# Patient Record
Sex: Male | Born: 1980 | Race: White | Hispanic: No | Marital: Married | Smoking: Never smoker
Health system: Southern US, Community
[De-identification: ages and names within clinical notes are randomized; demographics above are authoritative.]

## PROBLEM LIST (undated history)

## (undated) DIAGNOSIS — M109 Gout, unspecified: Secondary | ICD-10-CM

## (undated) DIAGNOSIS — A6920 Lyme disease, unspecified: Secondary | ICD-10-CM

## (undated) HISTORY — DX: Lyme disease, unspecified: A69.20

## (undated) HISTORY — DX: Gout, unspecified: M10.9

---

## 2001-01-29 ENCOUNTER — Emergency Department (HOSPITAL_COMMUNITY): Admission: EM | Admit: 2001-01-29 | Discharge: 2001-01-30 | Payer: Self-pay | Admitting: Emergency Medicine

## 2011-12-03 DIAGNOSIS — A6920 Lyme disease, unspecified: Secondary | ICD-10-CM

## 2011-12-03 HISTORY — DX: Lyme disease, unspecified: A69.20

## 2012-04-02 ENCOUNTER — Ambulatory Visit (INDEPENDENT_AMBULATORY_CARE_PROVIDER_SITE_OTHER): Payer: Self-pay | Admitting: Family Medicine

## 2012-04-02 ENCOUNTER — Encounter: Payer: Self-pay | Admitting: Family Medicine

## 2012-04-02 VITALS — BP 129/84 | HR 68 | Ht 73.0 in | Wt 214.0 lb

## 2012-04-02 DIAGNOSIS — Z7689 Persons encountering health services in other specified circumstances: Secondary | ICD-10-CM

## 2012-04-02 DIAGNOSIS — Z23 Encounter for immunization: Secondary | ICD-10-CM

## 2012-04-02 DIAGNOSIS — Z7189 Other specified counseling: Secondary | ICD-10-CM

## 2012-04-02 NOTE — Assessment & Plan Note (Signed)
Healthy adult male, no problems, will give Tdap and flu vaccines IM today.

## 2012-04-02 NOTE — Progress Notes (Signed)
Office Note 04/02/2012  CC:  Chief Complaint  Patient presents with  . Establish Care    TDAP and Flu shot    HPI:  Nathaniel Barr is a 31 y.o. White male who is here to establish care, needs Tdap and flu. Patient's most recent primary MD: [facility]. Old records were not reviewed prior to or during today's visit.  He is the proud new father of a 75 wk old girl.  Here to establish, no acute complaints.  PMH: no signif PMH History reviewed. No pertinent past medical history.  PSH: no signif PSH History reviewed. No pertinent past surgical history.  Family History  Problem Relation Age of Onset  . Heart disease Maternal Grandfather     CABG x 2  . Cancer Maternal Grandfather     ? bone cancer?    History   Social History  . Marital Status: Married    Spouse Name: N/A    Number of Children: N/A  . Years of Education: N/A   Occupational History  . Not on file.   Social History Main Topics  . Smoking status: Never Smoker   . Smokeless tobacco: Never Used  . Alcohol Use: No  . Drug Use: No  . Sexually Active: Yes   Other Topics Concern  . Not on file   Social History Narrative   Married, has one infant daughter and an adopted son.Orig from [place].College ed: [employer], went to 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    Outpatient Encounter Prescriptions as of 04/02/2012  Medication Sig Dispense Refill  . Multiple Vitamin (MULTIVITAMIN) tablet Take 1 tablet by mouth daily.      . vitamin C (ASCORBIC ACID) 500 MG tablet Take 500 mg by mouth daily.        No Known Allergies  ROS Review of Systems: no fatigue, no fevers, no joint swelling or aches, no rash, no HAs, no vision or hearing probs, no neck pain, no abd pain, no dental problems, no tremors, no focal weakness. PE; Blood pressure 129/84, pulse 68, height 6\' 1"  (1.854 m),  weight 214 lb (97.07 kg), SpO2 99.00%. Gen: Alert, well appearing.  Patient is oriented to person, place, time, and situation. ENT:   Eyes: no injection, icteris, swelling, or exudate.  EOMI, PERRLA. Nose: no drainage or turbinate edema/swelling.  No injection or focal lesion.  Mouth: lips without lesion/swelling.  Oral mucosa pink and moist.  Dentition intact and without obvious caries or gingival swelling.  Oropharynx without erythema, exudate, or swelling.  Neck - No masses or thyromegaly or limitation in range of motion CV: RRR, no m/r/g.   LUNGS: CTA bilat, nonlabored resps, good aeration in all lung fields. ABD: soft, NT, ND, BS normal.  No hepatospenomegaly or mass.  No bruits. EXT: no clubbing, cyanosis, or edema.    Pertinent labs:  None today  ASSESSMENT AND PLAN:   New Pt: obtain old records.  Establishing care with new doctor, encounter for Healthy adult male, no problems, will give Tdap and flu vaccines IM today.    Return for return for CPE at your convenience.Marland Kitchen

## 2012-05-16 ENCOUNTER — Encounter: Payer: Self-pay | Admitting: Family Medicine

## 2013-03-13 ENCOUNTER — Ambulatory Visit (INDEPENDENT_AMBULATORY_CARE_PROVIDER_SITE_OTHER): Payer: No Typology Code available for payment source | Admitting: Family Medicine

## 2013-03-13 ENCOUNTER — Encounter: Payer: Self-pay | Admitting: Family Medicine

## 2013-03-13 VITALS — BP 126/77 | HR 74 | Temp 98.4°F | Resp 16 | Ht 71.0 in | Wt 204.0 lb

## 2013-03-13 DIAGNOSIS — Z23 Encounter for immunization: Secondary | ICD-10-CM

## 2013-03-13 DIAGNOSIS — R5381 Other malaise: Secondary | ICD-10-CM | POA: Insufficient documentation

## 2013-03-13 LAB — URINALYSIS, ROUTINE W REFLEX MICROSCOPIC
Bilirubin Urine: NEGATIVE
Hgb urine dipstick: NEGATIVE
Leukocytes, UA: NEGATIVE
Nitrite: NEGATIVE
Total Protein, Urine: NEGATIVE
WBC, UA: NONE SEEN (ref 0–?)
pH: 6.5 (ref 5.0–8.0)

## 2013-03-13 LAB — CBC WITH DIFFERENTIAL/PLATELET
Basophils Absolute: 0 10*3/uL (ref 0.0–0.1)
Eosinophils Absolute: 0 10*3/uL (ref 0.0–0.7)
HCT: 42 % (ref 39.0–52.0)
Hemoglobin: 14.7 g/dL (ref 13.0–17.0)
Lymphs Abs: 1.6 10*3/uL (ref 0.7–4.0)
MCHC: 34.9 g/dL (ref 30.0–36.0)
Monocytes Absolute: 0.6 10*3/uL (ref 0.1–1.0)
Monocytes Relative: 9.6 % (ref 3.0–12.0)
Neutro Abs: 3.9 10*3/uL (ref 1.4–7.7)
Platelets: 237 10*3/uL (ref 150.0–400.0)
RDW: 13.5 % (ref 11.5–14.6)

## 2013-03-13 LAB — COMPREHENSIVE METABOLIC PANEL
ALT: 21 U/L (ref 0–53)
AST: 22 U/L (ref 0–37)
Alkaline Phosphatase: 60 U/L (ref 39–117)
BUN: 19 mg/dL (ref 6–23)
Calcium: 9.6 mg/dL (ref 8.4–10.5)
Creatinine, Ser: 0.8 mg/dL (ref 0.4–1.5)
Total Bilirubin: 1.4 mg/dL — ABNORMAL HIGH (ref 0.3–1.2)

## 2013-03-13 LAB — TSH: TSH: 0.96 u[IU]/mL (ref 0.35–5.50)

## 2013-03-13 NOTE — Patient Instructions (Addendum)
Have your wife watch you sleep and call and report to my nurse if she says you have periods in which you stop breathing for 4-5 seconds at a time.  Try to get more sleep---go to bed earlier at night, drink max of 2 caffeinated drinks per day (no high caffeine energy drinks).  Try to get some cardiovascular exercise at least every other day--this is a healthy exercise.   Heavy physical labor at work and lots of walking on the job is good, but it is often the type of activity that brings fatigue, whereas regular cardio exercise with make you feel MORE ENERGIZED.

## 2013-03-13 NOTE — Addendum Note (Signed)
Addended by: Baldemar Lenis R on: 03/13/2013 12:02 PM   Modules accepted: Orders

## 2013-03-13 NOTE — Assessment & Plan Note (Addendum)
Most likely from sleep debt, excessive work, stress of work and home life. Exam reassuring.  History w/out red flags.  History of being treated about 1-2 yrs ago for lyme dz, pt quite concerned about possibly still having it. Will check CBC, CMET, TSH, UA w/reflex micro, and borrelia burg. Ab.   Other instructions: Have your wife watch you sleep and call and report to my nurse if she says you have periods in which you stop breathing for 4-5 seconds at a time.  Try to get more sleep---go to bed earlier at night, drink max of 2 caffeinated drinks per day (no high caffeine energy drinks).  Try to get some cardiovascular exercise at least every other day--this is a healthy exercise.   Heavy physical labor at work and lots of walking on the job is good, but it is often the type of activity that brings fatigue, whereas regular cardio exercise with make you feel MORE ENERGIZED.

## 2013-03-13 NOTE — Progress Notes (Signed)
OFFICE VISIT  03/13/2013   CC:  Chief Complaint  Patient presents with  . Fatigue    about a month, worse in the last week      HPI:    Patient is a 32 y.o. Caucasian male who presents for feeling tired. Duration--approx 1 mo, worse for the last 1 week. Up a lot in night with a fussy baby (1 yr old) since baby was born per his report.  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 He gets 4-6 hours of sleep per night.    Says he has felt drained, tired, sleepy in daytime which he says is unusual for him. Gets frequent HAs in bitemporal areas lately, normally takes 1 goodies powder and this normally gets rid of HA. He drinks 2 special water drinks with caffeine in it a day.  No coffee, no sodas, no tea.  Drinks lots of "plain" water when working and hot and this quenches his thirst.  He does urinate quite a bit--appropriate for his fluid intake.  Says he gets lots of ticks, wonders if this has anything to do with his fatigue.  Ever since he was treated for lyme dz (he can't recall what abx he was rx'd), he has had a few pills leftover and he has been in the habit of taking one pill on occasion after he notes a tick on his body.  ROS: gets ulcers in mouth approx monthly, esp summer months--this has been a long term prob--nothing new.  No swelling or redness of joints.  No unexplained fevers, no night sweats.  Denies urinary sx's.  Denies GER, GI upset, diarrhea, melena, hematochezia, or constipation.  No skin rashes. No vision or hearing changes.   Exercise: lots of activity at work but no formal exercise.  Says he eats a good diet--"lots of food", so appetite is obviously good.  He does snore but only when lying on his back but wife has not reported any apnea.    Past Medical History  Diagnosis Date  . 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 Kathi Der [facility] at [facility].   FH: dad and grandfather with DM  No past surgical history on file.  Outpatient Prescriptions Prior to Visit   Medication Sig Dispense Refill  . Multiple Vitamin (MULTIVITAMIN) tablet Take 1 tablet by mouth daily.      . vitamin C (ASCORBIC ACID) 500 MG tablet Take 500 mg by mouth daily.       No facility-administered medications prior to visit.    Allergies  Allergen Reactions  . Shrimp [Shellfish Allergy] Other (See Comments)    unspecified    ROS As per HPI  PE: Blood pressure 126/77, pulse 74, temperature 98.4 F (36.9 C), temperature source Temporal, resp. rate 16, height 5\' 11"  (1.803 m), weight 204 lb (92.534 kg), SpO2 100.00%. Gen: Alert, well appearing.  Patient is oriented to person, place, time, and situation. AFFECT: pleasant, lucid thought and speech. ENT: Ears: EACs clear, normal epithelium.  TMs with good light reflex and landmarks bilaterally.  Eyes: no injection, icteris, swelling, or exudate.  EOMI, PERRLA. Nose: no drainage or turbinate edema/swelling.  No injection or focal lesion.  Mouth: lips without lesion/swelling.  Oral mucosa pink and moist, 3-4 shallow, grey-based superficial ulcerations are noted--about 3-4 mm in size (inside of lower lip, left buccal mucosa).  Dentition intact and without obvious caries or gingival swelling.  Oropharynx without erythema, exudate, or swelling.  Neck: supple/nontender.  No LAD, mass, or TM.  CV: RRR, no m/r/g.   LUNGS: CTA bilat, nonlabored resps, good aeration in all lung fields. ABD: soft, NT, ND, BS normal.  No hepatospenomegaly or mass.  No bruits. EXT: no clubbing, cyanosis, or edema.  Musculoskeletal: no joint swelling, erythema, warmth, or tenderness.  ROM of all joints intact. Skin - no sores or suspicious lesions or rashes or color changes Neuro: CN 2-12 intact bilaterally, strength 5/5 in proximal and distal upper extremities and lower extremities bilaterally.  No tremor.  No disdiadochokinesis.  No ataxia.  Upper extremity and lower extremity DTRs symmetric.  No pronator drift.  LABS:  None today  IMPRESSION AND  PLAN:  Other malaise and fatigue Most likely from sleep debt, excessive work, stress of work and home life. Exam reassuring.  History w/out red flags.  History of being treated about 1-2 yrs ago for lyme dz, pt quite concerned about possibly still having it. Will check CBC, CMET, TSH, UA w/reflex micro, and borrelia burg. Ab.   Other instructions: Have your wife watch you sleep and call and report to my nurse if she says you have periods in which you stop breathing for 4-5 seconds at a time.  Try to get more sleep---go to bed earlier at night, drink max of 2 caffeinated drinks per day (no high caffeine energy drinks).  Try to get some cardiovascular exercise at least every other day--this is a healthy exercise.   Heavy physical labor at work and lots of walking on the job is good, but it is often the type of activity that brings fatigue, whereas regular cardio exercise with make you feel MORE ENERGIZED.   Flu vaccine IM today.  An After Visit Summary was printed and given to the patient.  FOLLOW UP: Return in about 1 month (around 04/12/2013) for f/u fatigue.

## 2014-03-09 ENCOUNTER — Emergency Department (HOSPITAL_BASED_OUTPATIENT_CLINIC_OR_DEPARTMENT_OTHER)
Admission: EM | Admit: 2014-03-09 | Discharge: 2014-03-09 | Disposition: A | Discharge status: Home / Self Care | Payer: No Typology Code available for payment source | Attending: Emergency Medicine | Admitting: Emergency Medicine

## 2014-03-09 ENCOUNTER — Encounter (HOSPITAL_BASED_OUTPATIENT_CLINIC_OR_DEPARTMENT_OTHER): Payer: Self-pay | Admitting: Emergency Medicine

## 2014-03-09 DIAGNOSIS — J029 Acute pharyngitis, unspecified: Secondary | ICD-10-CM | POA: Insufficient documentation

## 2014-03-09 DIAGNOSIS — IMO0001 Reserved for inherently not codable concepts without codable children: Secondary | ICD-10-CM | POA: Diagnosis not present

## 2014-03-09 DIAGNOSIS — R51 Headache: Secondary | ICD-10-CM | POA: Insufficient documentation

## 2014-03-09 DIAGNOSIS — R6883 Chills (without fever): Secondary | ICD-10-CM | POA: Diagnosis not present

## 2014-03-09 DIAGNOSIS — R259 Unspecified abnormal involuntary movements: Secondary | ICD-10-CM | POA: Diagnosis not present

## 2014-03-09 DIAGNOSIS — Z79899 Other long term (current) drug therapy: Secondary | ICD-10-CM | POA: Insufficient documentation

## 2014-03-09 DIAGNOSIS — R0602 Shortness of breath: Secondary | ICD-10-CM | POA: Diagnosis not present

## 2014-03-09 DIAGNOSIS — R5383 Other fatigue: Secondary | ICD-10-CM

## 2014-03-09 DIAGNOSIS — Z872 Personal history of diseases of the skin and subcutaneous tissue: Secondary | ICD-10-CM | POA: Diagnosis not present

## 2014-03-09 DIAGNOSIS — R5381 Other malaise: Secondary | ICD-10-CM | POA: Diagnosis not present

## 2014-03-09 DIAGNOSIS — R519 Headache, unspecified: Secondary | ICD-10-CM

## 2014-03-09 MED ORDER — DOXYCYCLINE HYCLATE 100 MG PO CAPS: 100.0000 mg | ORAL_CAPSULE | Freq: Two times a day (BID) | ORAL | Status: DC

## 2014-03-09 NOTE — Discharge Instructions (Signed)
General Headache Without Cause A headache is pain or discomfort felt around the head or neck area. The specific cause of a headache may not be found. There are many causes and types of headaches. A few common ones are:  Tension headaches.  Migraine headaches.  Cluster headaches.  Chronic daily headaches. HOME CARE INSTRUCTIONS   Keep all follow-up appointments with your caregiver or any specialist referral.  Only take over-the-counter or prescription medicines for pain or discomfort as directed by your caregiver.  Lie down in a dark, quiet room when you have a headache.  Keep a headache journal to find out what may trigger your migraine headaches. For example, write down:  What you eat and drink.  How much sleep you get.  Any change to your diet or medicines.  Try massage or other relaxation techniques.  Put ice packs or heat on the head and neck. Use these 3 to 4 times per day for 15 to 20 minutes each time, or as needed.  Limit stress.  Sit up straight, and do not tense your muscles.  Quit smoking if you smoke.  Limit alcohol use.  Decrease the amount of caffeine you drink, or stop drinking caffeine.  Eat and sleep on a regular schedule.  Get 7 to 9 hours of sleep, or as recommended by your caregiver.  Keep lights dim if bright lights bother you and make your headaches worse. SEEK MEDICAL CARE IF:   You have problems with the medicines you were prescribed.  Your medicines are not working.  You have a change from the usual headache.  You have nausea or vomiting. SEEK IMMEDIATE MEDICAL CARE IF:   Your headache becomes severe.  You have a fever.  You have a stiff neck.  You have loss of vision.  You have muscular weakness or loss of muscle control.  You start losing your balance or have trouble walking.  You feel faint or pass out.  You have severe symptoms that are different from your first symptoms. MAKE SURE YOU:   Understand these  instructions.  Will watch your condition.  Will get help right away if you are not doing well or get worse. Document Released: 06/20/2005 Document Revised: 09/12/2011 Document Reviewed: 07/06/2011 [facility] Patient Information 2015 [place], [place]. This information is not intended to replace advice given to you by your health care provider. Make sure you discuss any questions you have with your health care provider.    Viral Infections A viral infection can be caused by different types of viruses.Most viral infections are not serious and resolve on their own. However, some infections may cause severe symptoms and may lead to further complications. SYMPTOMS Viruses can frequently cause:  Minor sore throat.  Aches and pains.  Headaches.  Runny nose.  Different types of rashes.  Watery eyes.  Tiredness.  Cough.  Loss of appetite.  Gastrointestinal infections, resulting in nausea, vomiting, and diarrhea. These symptoms do not respond to antibiotics because the infection is not caused by bacteria. However, you might catch a bacterial infection following the viral infection. This is sometimes called a "superinfection." Symptoms of such a bacterial infection may include:  Worsening sore throat with pus and difficulty swallowing.  Swollen neck glands.  Chills and a high or persistent fever.  Severe headache.  Tenderness over the sinuses.  Persistent overall ill feeling (malaise), muscle aches, and tiredness (fatigue).  Persistent cough.  Yellow, green, or brown mucus production with coughing. HOME CARE INSTRUCTIONS   Only take  over-the-counter or prescription medicines for pain, discomfort, diarrhea, or fever as directed by your caregiver.  Drink enough water and fluids to keep your urine clear or pale yellow. Sports drinks can provide valuable electrolytes, sugars, and hydration.  Get plenty of rest and maintain proper nutrition. Soups and broths with crackers or  rice are fine. SEEK IMMEDIATE MEDICAL CARE IF:   You have severe headaches, shortness of breath, chest pain, neck pain, or an unusual rash.  You have uncontrolled vomiting, diarrhea, or you are unable to keep down fluids.  You or your child has an oral temperature above 102 F (38.9 C), not controlled by medicine.  Your baby is older than 3 months with a rectal temperature of 102 F (38.9 C) or higher.  Your baby is 36 months old or younger with a rectal temperature of 100.4 F (38 C) or higher. MAKE SURE YOU:   Understand these instructions.  Will watch your condition.  Will get help right away if you are not doing well or get worse. Document Released: 03/30/2005 Document Revised: 09/12/2011 Document Reviewed: 10/25/2010 [facility] Patient Information 2015 [place], [place]. This information is not intended to replace advice given to you by your health care provider. Make sure you discuss any questions you have with your health care provider.     Lyme Disease You may have been bitten by a tick and are to watch for the development of Lyme Disease. Lyme Disease is an infection that is caused by a bacteria The bacteria causing this disease is named Borreilia burgdorferi. If a tick is infected with this bacteria and then bites you, then Lyme Disease may occur. These ticks are carried by deer and rodents such as rabbits and mice and infest grassy as well as forested areas. Fortunately most tick bites do not cause Lyme Disease.  Lyme Disease is easier to prevent than to treat. First, covering your legs with clothing when walking in areas where ticks are possibly abundant will prevent their attachment because ticks tend to stay within inches of the ground. Second, using insecticides containing DEET can be applied on skin or clothing. Last, because it takes about 12 to 24 hours for the tick to transmit the disease after attachment to the human host, you should inspect your body for ticks twice a  day when you are in areas where Lyme Disease is common. You must look thoroughly when searching for ticks. The Ixodes tick that carries Lyme Disease is very small. It is around the size of a sesame seed (picture of tick is not actual size). Removal is best done by grasping the tick by the head and pulling it out. Do not to squeeze the body of the tick. This could inject the infecting bacteria into the bite site. Wash the area of the bite with an antiseptic solution after removal.  Lyme Disease is a disease that may affect many body systems. Because of the small size of the biting tick, most people do not notice being bitten. The first sign of an infection is usually a round red rash that extends out from the center of the tick bite. The center of the lesion may be blood colored (hemorrhagic) or have tiny blisters (vesicular). Most lesions have bright red outer borders and partial central clearing. This rash may extend out many inches in diameter, and multiple lesions may be present. Other symptoms such as fatigue, headaches, chills and fever, general achiness and swelling of lymph glands may also occur. If this first  stage of the disease is left untreated, these symptoms may gradually resolve by themselves, or progressive symptoms may occur because of spread of infection to other areas of the body.  Follow up with your caregiver to have testing and treatment if you have a tick bite and you develop any of the above complaints. Your caregiver may recommend preventative (prophylactic) medications which kill bacteria (antibiotics). Once a diagnosis of Lyme Disease is made, antibiotic treatment is highly likely to cure the disease. Effective treatment of late stage Lyme Disease may require longer courses of antibiotic therapy.  MAKE SURE YOU:   Understand these instructions.  Will watch your condition.  Will get help right away if you are not doing well or get worse. Document Released: 09/26/2000 Document  Revised: 09/12/2011 Document Reviewed: 11/28/2008 [facility] Patient Information 2015 [place], [place]. This information is not intended to replace advice given to you by your health care provider. Make sure you discuss any questions you have with your health care provider.    Doxycycline tablets or capsules What is this medicine? DOXYCYCLINE (dox i SYE kleen) is a tetracycline antibiotic. It kills certain bacteria or stops their growth. It is used to treat many kinds of infections, like dental, skin, respiratory, and urinary tract infections. It also treats acne, Lyme disease, malaria, and certain sexually transmitted infections. This medicine may be used for other purposes; ask your health care provider or pharmacist if you have questions. COMMON BRAND NAME(S): Acticlate, Adoxa, Adoxa CK, Adoxa Pak, Adoxa TT, Alodox, Avidoxy, Doxal, Monodox, Morgidox 1x, Morgidox 1x Kit, Morgidox 2x, Morgidox 2x Kit, Ocudox, Vibra-Tabs, Vibramycin What should I tell my health care provider before I take this medicine? They need to know if you have any of these conditions: -liver disease -long exposure to sunlight like working outdoors -stomach problems like colitis -an unusual or allergic reaction to doxycycline, tetracycline antibiotics, other medicines, foods, dyes, or preservatives -pregnant or trying to get pregnant -breast-feeding How should I use this medicine? Take this medicine by mouth with a full glass of water. Follow the directions on the prescription label. It is best to take this medicine without food, but if it upsets your stomach take it with food. Take your medicine at regular intervals. Do not take your medicine more often than directed. Take all of your medicine as directed even if you think you are better. Do not skip doses or stop your medicine early. Talk to your pediatrician regarding the use of this medicine in children. Special care may be needed. While this drug may be prescribed for  children as young as 62 years old for selected conditions, precautions do apply. Overdosage: If you think you have taken too much of this medicine contact a poison control center or emergency room at once. NOTE: This medicine is only for you. Do not share this medicine with others. What if I miss a dose? If you miss a dose, take it as soon as you can. If it is almost time for your next dose, take only that dose. Do not take double or extra doses. What may interact with this medicine? -antacids -barbiturates -birth control pills -bismuth subsalicylate -carbamazepine -methoxyflurane -other antibiotics -phenytoin -vitamins that contain iron -warfarin This list may not describe all possible interactions. Give your health care provider a list of all the medicines, herbs, non-prescription drugs, or dietary supplements you use. Also tell them if you smoke, drink alcohol, or use illegal drugs. Some items may interact with your medicine. What should I watch  for while using this medicine? Tell your doctor or health care professional if your symptoms do not improve. Do not treat diarrhea with over the counter products. Contact your doctor if you have diarrhea that lasts more than 2 days or if it is severe and watery. Do not take this medicine just before going to bed. It may not dissolve properly when you lay down and can cause pain in your throat. Drink plenty of fluids while taking this medicine to also help reduce irritation in your throat. This medicine can make you more sensitive to the sun. Keep out of the sun. If you cannot avoid being in the sun, wear protective clothing and use sunscreen. Do not use sun lamps or tanning beds/booths. Birth control pills may not work properly while you are taking this medicine. Talk to your doctor about using an extra method of birth control. If you are being treated for a sexually transmitted infection, avoid sexual contact until you have finished your treatment.  Your sexual partner may also need treatment. Avoid antacids, aluminum, calcium, magnesium, and iron products for 4 hours before and 2 hours after taking a dose of this medicine. If you are using this medicine to prevent malaria, you should still protect yourself from contact with mosquitos. Stay in screened-in areas, use mosquito nets, keep your body covered, and use an insect repellent. What side effects may I notice from receiving this medicine? Side effects that you should report to your doctor or health care professional as soon as possible: -allergic reactions like skin rash, itching or hives, swelling of the face, lips, or tongue -difficulty breathing -fever -itching in the rectal or genital area -pain on swallowing -redness, blistering, peeling or loosening of the skin, including inside the mouth -severe stomach pain or cramps -unusual bleeding or bruising -unusually weak or tired -yellowing of the eyes or skin Side effects that usually do not require medical attention (report to your doctor or health care professional if they continue or are bothersome): -diarrhea -loss of appetite -nausea, vomiting This list may not describe all possible side effects. Call your doctor for medical advice about side effects. You may report side effects to FDA at 1-800-FDA-1088. Where should I keep my medicine? Keep out of the reach of children. Store at room temperature, below 30 degrees C (86 degrees F). Protect from light. Keep container tightly closed. Throw away any unused medicine after the expiration date. Taking this medicine after the expiration date can make you seriously ill. NOTE: This sheet is a summary. It may not cover all possible information. If you have questions about this medicine, talk to your doctor, pharmacist, or health care provider.  2015, [employer]. (2013-04-26 13:58:06)

## 2014-03-09 NOTE — ED Notes (Signed)
Patient reports chills, weakness,  Removed tick from groin last week

## 2014-03-09 NOTE — ED Provider Notes (Signed)
CSN: [id number]     Arrival date & time 03/09/14  1834 History  This chart was scribed for Audree Camel, MD by Julian Hy, ED Scribe. The patient was seen in [facility]. The patient's care was started at 7:43 PM.     Chief Complaint  Patient presents with  . Sore Throat   The history is provided by the patient. No language interpreter was used.   HPI Comments: Treavon Castilleja is a 33 y.o. male who presents to the Emergency Department complaining at the recommendation of a "[facility] clinic" for a test for lyme disease. Pt notes associated chills, fatigue, headaches, SOB, shakiness, and body aches. Pt has had a headache today and last few days, that he attempted to relieve with Advil with minimal relief. Pt went to the minute clinic and the rapid strep screen that was negative. Pt had a deer tick bite last week on his right groin. Pt denies itching or seeing any marks. Pt denies using doxycyline for this tick bite. Pt keeps doxycycline prescription in his truck. 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 Pt notes that several years ago he was told he had the signs of lyme's disease. Pt's son has been sick and diagnosed with strep, which is why wife encouraged him to go get tested (strep neg per him). He denies sore throat. Pt denies joint pain. Pt had previous neck stiffness that has a since resolved. Pt had a negative flu test.  Past Medical History  Diagnosis Date  . 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 Kathi Der notes at [facility].   History reviewed. No pertinent past surgical history. Family History  Problem Relation Age of Onset  . Heart disease Maternal Grandfather     CABG x 2  . Cancer Maternal Grandfather     ? bone cancer?   History  Substance Use Topics  . Smoking status: Never Smoker   . Smokeless tobacco: Never Used  . Alcohol Use: No    Review of Systems  Constitutional: Positive for chills and fatigue.  HENT: Negative for sore throat.    Respiratory: Positive for shortness of breath.   Musculoskeletal: Positive for myalgias. Negative for neck stiffness.  Skin: Negative for rash.  Neurological: Positive for headaches.  All other systems reviewed and are negative.  Allergies  Shrimp  Home Medications   Prior to Admission medications   Medication Sig Start Date End Date Taking? Authorizing Provider  Multiple Vitamin (MULTIVITAMIN) tablet Take 1 tablet by mouth daily.    Historical Provider, MD  vitamin C (ASCORBIC ACID) 500 MG tablet Take 500 mg by mouth daily.    Historical Provider, MD   Triage Vitals: BP 120/71  Pulse 79  Temp(Src) 98.1 F (36.7 C) (Oral)  Resp 16  Wt 195 lb (88.451 kg)  SpO2 100% Physical Exam  Nursing note and vitals reviewed. Constitutional: He is oriented to person, place, and time. He appears well-developed and well-nourished. No distress.  HENT:  Head: Normocephalic and atraumatic.  Right Ear: External ear normal.  Left Ear: External ear normal.  Nose: Nose normal.  Mouth/Throat: Oropharynx is clear and moist. No oropharyngeal exudate.  Neck: Neck supple. No tracheal deviation present.  Cardiovascular: Normal rate, regular rhythm and normal heart sounds.   Pulmonary/Chest: Effort normal and breath sounds normal. No respiratory distress.  Abdominal: He exhibits no distension. There is no tenderness.  Musculoskeletal: Normal range of motion.  Neurological: He is alert  and oriented to person, place, and time.  Skin: Skin is warm and dry. No rash noted. No pallor.  Psychiatric: He has a normal mood and affect. His behavior is normal.    ED Course  Procedures (including critical care time) DIAGNOSTIC STUDIES: Oxygen Saturation is 100% on RA, normal by my interpretation.    COORDINATION OF CARE: 7:54 PM- Patient informed of current plan for treatment and evaluation and agrees with plan at this time.  Labs Review Labs Reviewed  ROCKY MTN SPOTTED FVR AB, IGG-BLOOD  B. BURGDORFI  ANTIBODIES    Imaging Review No results found.   EKG Interpretation None      MDM   Final diagnoses:  Nonintractable headache, unspecified chronicity pattern, unspecified headache type    Patient with generalized body aches, headache and likely viral like syndrome. Sent here for testing for lyme. He has apparently had erythema migrans rash in past, and apparently is bit by deer ticks frequently at his work. My suspicion is that his symptoms are coming from whatever illness his son and brother have had this week, but given these risk factors will test for lyme and RMSF and start treatment given he is higher risk. Recommend f/u with PCP.  This chart was scribed in my presence and reviewed by me personally.   Audree Camel, MD 03/10/14 1146

## 2014-03-09 NOTE — ED Notes (Signed)
Pt sent from [facility]. Pts son and brother have strep throat at home.  Pt has scratchy throat. Reports tick was on him last week. Sent for eval of lyme disease.

## 2014-03-12 LAB — B. BURGDORFI ANTIBODIES: B burgdorferi Ab IgG+IgM: 0.65 {ISR}

## 2014-03-12 LAB — ROCKY MTN SPOTTED FVR AB, IGG-BLOOD: RMSF IgG: 0.16 IV

## 2014-09-12 DIAGNOSIS — Z8619 Personal history of other infectious and parasitic diseases: Secondary | ICD-10-CM | POA: Insufficient documentation

## 2014-09-12 DIAGNOSIS — R000 Tachycardia, unspecified: Secondary | ICD-10-CM | POA: Diagnosis not present

## 2014-09-12 DIAGNOSIS — Y9389 Activity, other specified: Secondary | ICD-10-CM | POA: Insufficient documentation

## 2014-09-12 DIAGNOSIS — W228XXA Striking against or struck by other objects, initial encounter: Secondary | ICD-10-CM | POA: Insufficient documentation

## 2014-09-12 DIAGNOSIS — J111 Influenza due to unidentified influenza virus with other respiratory manifestations: Secondary | ICD-10-CM | POA: Diagnosis not present

## 2014-09-12 DIAGNOSIS — S0990XA Unspecified injury of head, initial encounter: Secondary | ICD-10-CM | POA: Insufficient documentation

## 2014-09-12 DIAGNOSIS — Y9289 Other specified places as the place of occurrence of the external cause: Secondary | ICD-10-CM | POA: Insufficient documentation

## 2014-09-12 DIAGNOSIS — Z79899 Other long term (current) drug therapy: Secondary | ICD-10-CM | POA: Diagnosis not present

## 2014-09-12 DIAGNOSIS — R509 Fever, unspecified: Secondary | ICD-10-CM | POA: Diagnosis present

## 2014-09-12 DIAGNOSIS — Y998 Other external cause status: Secondary | ICD-10-CM | POA: Diagnosis not present

## 2014-09-12 LAB — URINE MICROSCOPIC-ADD ON

## 2014-09-12 LAB — URINALYSIS, ROUTINE W REFLEX MICROSCOPIC
BILIRUBIN URINE: NEGATIVE
Glucose, UA: NEGATIVE mg/dL
Ketones, ur: 15 mg/dL — AB
Leukocytes, UA: NEGATIVE
NITRITE: NEGATIVE
PH: 5 (ref 5.0–8.0)
Protein, ur: NEGATIVE mg/dL
Specific Gravity, Urine: 1.025 (ref 1.005–1.030)
UROBILINOGEN UA: 0.2 mg/dL (ref 0.0–1.0)

## 2014-09-12 MED ORDER — ACETAMINOPHEN 325 MG PO TABS
650.0000 mg | ORAL_TABLET | Freq: Once | ORAL | Status: AC
  Administered 2014-09-12: 650 mg via ORAL
  Filled 2014-09-12: qty 2

## 2014-09-12 NOTE — ED Notes (Signed)
Fever, nausea, dizziness since earlier today. 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 No LOC. Does not know if the dizziness is related.

## 2014-09-13 ENCOUNTER — Emergency Department (HOSPITAL_BASED_OUTPATIENT_CLINIC_OR_DEPARTMENT_OTHER): Payer: BLUE CROSS/BLUE SHIELD

## 2014-09-13 ENCOUNTER — Emergency Department (HOSPITAL_BASED_OUTPATIENT_CLINIC_OR_DEPARTMENT_OTHER)
Admission: EM | Admit: 2014-09-13 | Discharge: 2014-09-13 | Disposition: A | Discharge status: Home / Self Care | Payer: BLUE CROSS/BLUE SHIELD | Attending: Emergency Medicine | Admitting: Emergency Medicine

## 2014-09-13 DIAGNOSIS — T1490XA Injury, unspecified, initial encounter: Secondary | ICD-10-CM

## 2014-09-13 DIAGNOSIS — R079 Chest pain, unspecified: Secondary | ICD-10-CM

## 2014-09-13 DIAGNOSIS — J111 Influenza due to unidentified influenza virus with other respiratory manifestations: Secondary | ICD-10-CM

## 2014-09-13 MED ORDER — OSELTAMIVIR PHOSPHATE 75 MG PO CAPS: 75.0000 mg | ORAL_CAPSULE | Freq: Two times a day (BID) | ORAL | Status: DC

## 2014-09-13 MED ORDER — IBUPROFEN 400 MG PO TABS
400.0000 mg | ORAL_TABLET | Freq: Once | ORAL | Status: AC
  Administered 2014-09-13: 400 mg via ORAL
  Filled 2014-09-13: qty 1

## 2014-09-13 MED ORDER — KETOROLAC TROMETHAMINE 60 MG/2ML IM SOLN: 60.0000 mg | Freq: Once | INTRAMUSCULAR | Status: DC

## 2014-09-13 MED ORDER — SODIUM CHLORIDE 0.9 % IV SOLN
Freq: Once | INTRAVENOUS | Status: AC
  Administered 2014-09-13: 02:00:00 via INTRAVENOUS

## 2014-09-13 NOTE — ED Notes (Signed)
EDP in to see pt at time of d/c 

## 2014-09-13 NOTE — Discharge Instructions (Signed)

## 2014-09-13 NOTE — ED Provider Notes (Signed)
CSN: [id number]     Arrival date & time 09/12/14  2228 History   First MD Initiated Contact with Patient 09/13/14 0053     Chief Complaint  Patient presents with  . Fever     (Consider location/radiation/quality/duration/timing/severity/associated sxs/prior Treatment) HPI Comments: Patient here complaining of fever nausea and dizziness which began today. Has had a nonproductive cough 3 days. Denies any vomiting, diarrhea. No chest or abdominal pain. Denies any urinary symptoms. Patient also complains of headache which she partially attributes to striking his head a week ago. Did have swelling initially which is since resolved. Denies any current neck pain. No photophobia at this time. Symptoms persistent and use over-the-counter medications without relief.  Patient is a 34 y.o. male presenting with fever. The history is provided by the patient.  Fever   Past Medical History  Diagnosis Date  . 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   No past surgical history on file. Family History  Problem Relation Age of Onset  . Heart disease Maternal Grandfather     CABG x 2  . Cancer Maternal Grandfather     ? bone cancer?   History  Substance Use Topics  . Smoking status: Never Smoker   . Smokeless tobacco: Never Used  . Alcohol Use: No    Review of Systems  Constitutional: Positive for fever.  All other systems reviewed and are negative.     Allergies  Shrimp  Home Medications   Prior to Admission medications   Medication Sig Start Date End Date Taking? Authorizing Provider  doxycycline (VIBRAMYCIN) 100 MG capsule Take 1 capsule (100 mg total) by mouth 2 (two) times daily. One po bid x 10 days 03/09/14   Pricilla LovelessScott Goldston, MD  Multiple Vitamin (MULTIVITAMIN) tablet Take 1 tablet by mouth daily.    Historical Provider, MD  vitamin C (ASCORBIC ACID) 500 MG tablet Take 500 mg by mouth daily.    Historical Provider, MD   BP 129/62 mmHg  Pulse  108  Temp(Src) 101.5 F (38.6 C) (Oral)  Resp 20  Ht 6' (1.829 m)  Wt 215 lb (97.523 kg)  BMI 29.15 kg/m2  SpO2 98% Physical Exam  Constitutional: He is oriented to person, place, and time. He appears well-developed and well-nourished.  Non-toxic appearance. No distress.  HENT:  Head: Normocephalic and atraumatic.  Eyes: Conjunctivae, EOM and lids are normal. Pupils are equal, round, and reactive to light.  Neck: Normal range of motion. Neck supple. No tracheal deviation present. No thyroid mass present.  Cardiovascular: Regular rhythm and normal heart sounds.  Tachycardia present.  Exam reveals no gallop.   No murmur heard. Pulmonary/Chest: Effort normal and breath sounds normal. No stridor. No respiratory distress. He has no decreased breath sounds. He has no wheezes. He has no rhonchi. He has no rales.  Abdominal: Soft. Normal appearance and bowel sounds are normal. He exhibits no distension. There is no tenderness. There is no rebound and no CVA tenderness.  Musculoskeletal: Normal range of motion. He exhibits no edema or tenderness.  Neurological: He is alert and oriented to person, place, and time. He has normal strength. No cranial nerve deficit or sensory deficit. GCS eye subscore is 4. GCS verbal subscore is 5. GCS motor subscore is 6.  Skin: Skin is warm and dry. No abrasion and no rash noted.  Psychiatric: He has a normal mood and affect. His speech is normal and behavior is normal.  Nursing  note and vitals reviewed.   ED Course  Procedures (including critical care time) Labs Review Labs Reviewed  URINALYSIS, ROUTINE W REFLEX MICROSCOPIC - Abnormal; Notable for the following:    Hgb urine dipstick MODERATE (*)    Ketones, ur 15 (*)    All other components within normal limits  URINE MICROSCOPIC-ADD ON    Imaging Review No results found.   EKG Interpretation None      MDM   Final diagnoses:  Chest pain  Trauma    Patient given Toradol and feels better.  X-rays are negative. Suspect that his influenza and will place patient on Tamiflu    Lorre Nick, MD 09/13/14 [phone]

## 2014-09-13 NOTE — ED Notes (Signed)
Back from xray

## 2014-09-18 ENCOUNTER — Ambulatory Visit (INDEPENDENT_AMBULATORY_CARE_PROVIDER_SITE_OTHER): Payer: BLUE CROSS/BLUE SHIELD | Admitting: Family Medicine

## 2014-09-18 ENCOUNTER — Encounter: Payer: Self-pay | Admitting: Family Medicine

## 2014-09-18 VITALS — BP 120/70 | HR 77 | Temp 98.4°F | Ht 71.0 in | Wt 221.0 lb

## 2014-09-18 DIAGNOSIS — J111 Influenza due to unidentified influenza virus with other respiratory manifestations: Secondary | ICD-10-CM

## 2014-09-18 DIAGNOSIS — R51 Headache: Secondary | ICD-10-CM

## 2014-09-18 DIAGNOSIS — R519 Headache, unspecified: Secondary | ICD-10-CM

## 2014-09-18 DIAGNOSIS — R69 Illness, unspecified: Principal | ICD-10-CM

## 2014-09-18 DIAGNOSIS — R5383 Other fatigue: Secondary | ICD-10-CM

## 2014-09-18 DIAGNOSIS — R110 Nausea: Secondary | ICD-10-CM

## 2014-09-18 NOTE — Progress Notes (Signed)
Pre visit review using our clinic review tool, if applicable. No additional management support is needed unless otherwise documented below in the visit note. 

## 2014-09-18 NOTE — Progress Notes (Signed)
OFFICE VISIT  09/18/2014   CC:  Chief Complaint  Patient presents with  . Headache  . Fever   HPI:    Patient is a 34 y.o. Caucasian male who presents for headache and fever.   Six days ago he got nauseous, HA, Tm 104, cough and chest congestion, no ST but achy/fatigued all over. He went to the ED (he had hit his head the week prior when working, no LOC, and he started to have some positional vertigo). When he gave this hx at hospital they got a CT scan of brain and all was normal.  Fluids were given in ED.  CXR done and was normal. He was sent home on tamiflu and has just finished this.  Last 2 days has felt really good until last night he began to feel very cold again, nauseated again, peri-orbital HA and fatigue.  Cough feels improved, worse when supine.  Not really that stuffy or congested. Took tylenol/ibup alt q3h when most ill.  Took 400 mg ibup this morning.  Past Medical History  Diagnosis Date  . 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    History reviewed. No pertinent past surgical history.  MEDS: as per HPI  Allergies  Allergen Reactions  . Shrimp [Shellfish Allergy] Other (See Comments)    unspecified    ROS As per HPI  PE: Blood pressure 120/70, pulse 77, temperature 98.4 F (36.9 C), temperature source Oral, height 5\' 11"  (1.803 m), weight 221 lb (100.245 kg), SpO2 97 %. VS: noted--normal. Gen: alert, NAD, NONTOXIC APPEARING. HEENT: eyes without injection, drainage, or swelling.  Ears: EACs clear, TMs with normal light reflex and landmarks.  Nose: Clear rhinorrhea, with some dried, crusty exudate adherent to mildly injected mucosa.  No purulent d/c.  No paranasal sinus TTP.  No facial swelling.  Throat and mouth without focal lesion.  No pharyngial swelling, erythema, or exudate.   Neck: supple, no LAD.   LUNGS: CTA bilat, nonlabored resps.   CV: RRR, no m/r/g. EXT: no c/c/e SKIN: no rash  LABS:  none  IMPRESSION  AND PLAN:  Resolving ILI, some rebound of nausea/HA/fatigue. I don't see any sign of secondary infection, and I don't see any indication at this time for blood tests or imaging to be done. Encouraged rest, hydration, and also think his nausea may be related to worsened GER since all the coughing he has done recently. Instructions: Buy generic OTC zantac 150mg , take 1 tab twice a day until cough is resolved.  Take OTC generic robitussin DM at night to help with cough.  FOLLOW UP: Return if symptoms worsen or fail to improve.

## 2014-09-18 NOTE — Patient Instructions (Signed)
Buy generic OTC zantac 150mg , take 1 tab twice a day until cough is resolved.  Take OTC generic robitussin DM at night to help with cough.

## 2014-12-04 ENCOUNTER — Ambulatory Visit: Payer: BLUE CROSS/BLUE SHIELD | Admitting: Family Medicine

## 2015-01-01 ENCOUNTER — Ambulatory Visit (INDEPENDENT_AMBULATORY_CARE_PROVIDER_SITE_OTHER): Payer: BLUE CROSS/BLUE SHIELD | Admitting: Nurse Practitioner

## 2015-01-01 ENCOUNTER — Encounter: Payer: Self-pay | Admitting: Nurse Practitioner

## 2015-01-01 VITALS — BP 116/70 | HR 82 | Temp 98.0°F | Resp 18 | Ht 71.0 in | Wt 224.0 lb

## 2015-01-01 DIAGNOSIS — B9789 Other viral agents as the cause of diseases classified elsewhere: Principal | ICD-10-CM

## 2015-01-01 DIAGNOSIS — J069 Acute upper respiratory infection, unspecified: Secondary | ICD-10-CM

## 2015-01-01 MED ORDER — PREDNISONE 10 MG PO TABS: ORAL_TABLET | ORAL | Status: DC

## 2015-01-01 MED ORDER — BENZONATATE 200 MG PO CAPS: 200.0000 mg | ORAL_CAPSULE | Freq: Three times a day (TID) | ORAL | Status: DC | PRN

## 2015-01-01 NOTE — Progress Notes (Signed)
Pre visit review using our clinic review tool, if applicable. No additional management support is needed unless otherwise documented below in the visit note. 

## 2015-01-01 NOTE — Progress Notes (Signed)
   Subjective:    Patient ID: Nathaniel Barr, male    DOB: 1981/02/03, 34 y.o.   MRN: [id number]  Cough This is a new problem. The current episode started 1 to 4 weeks ago (10d). The problem has been gradually improving. The cough is productive of sputum. Associated symptoms include chest pain (with cough), a fever (resolved), heartburn, nasal congestion (resolved) and a sore throat (resolved). Pertinent negatives include no chills, ear congestion, ear pain, headaches, shortness of breath or wheezing. Nothing aggravates the symptoms. He has tried OTC cough suppressant for the symptoms. The treatment provided mild relief.      Review of Systems  Constitutional: Positive for fever (resolved). Negative for chills.  HENT: Positive for sore throat (resolved). Negative for ear pain.   Respiratory: Positive for cough. Negative for shortness of breath and wheezing.   Cardiovascular: Positive for chest pain (with cough).  Gastrointestinal: Positive for heartburn.  Neurological: Negative for headaches.       Objective:   Physical Exam  Constitutional: He is oriented to person, place, and time. He appears well-developed and well-nourished. No distress.  HENT:  Head: Normocephalic and atraumatic.  Eyes: Conjunctivae are normal. Right eye exhibits no discharge.  Neck: Normal range of motion. No thyromegaly present.  Cardiovascular: Normal rate, regular rhythm and normal heart sounds.   No murmur heard. Pulmonary/Chest: Effort normal and breath sounds normal.  Lymphadenopathy:    He has no cervical adenopathy.  Neurological: He is alert and oriented to person, place, and time.  Skin: Skin is warm and dry.  Psychiatric: He has a normal mood and affect. His behavior is normal. Thought content normal.  Vitals reviewed.         Assessment & Plan:  1. Viral URI with cough Symptom support - benzonatate (TESSALON) 200 MG capsule; Take 1 capsule (200 mg total) by mouth 3 (three) times daily  as needed for cough.  Dispense: 60 capsule; Refill: 0 - predniSONE (DELTASONE) 10 MG tablet; Take 4Tpo qam X 2d, then 3T po qam X 2d, then 2T po qd X 2d, then 1T po qam X 2d.  Dispense: 20 tablet; Refill: 0  F/u PRN

## 2015-01-01 NOTE — Patient Instructions (Signed)
This is a viral illness. Cough may last 3-4 weeks. Start prednisone. Take in mornings as it can keep you awake. Take benzonatate capsules to decrease dry cough. Rest. Let us know if you develop fever or chest pain with inspiration.  Bronchitis Bronchitis is inflammation of the airways that extend from the windpipe into the lungs (bronchi). The inflammation often causes mucus to develop, which leads to a cough. If the inflammation becomes severe, it may cause shortness of breath. CAUSES  Bronchitis may be caused by:   Viral infections.   Bacteria.   Cigarette smoke.   Allergens, pollutants, and other irritants.  SIGNS AND SYMPTOMS  The most common symptom of bronchitis is a frequent cough that produces mucus. Other symptoms include:  Fever.   Body aches.   Chest congestion.   Chills.   Shortness of breath.   Sore throat.  DIAGNOSIS  Bronchitis is usually diagnosed through a medical history and physical exam. Tests, such as chest X-rays, are sometimes done to rule out other conditions.  TREATMENT  You may need to avoid contact with whatever caused the problem (smoking, for example). Medicines are sometimes needed. These may include:  Antibiotics. These may be prescribed if the condition is caused by bacteria.  Cough suppressants. These may be prescribed for relief of cough symptoms.   Inhaled medicines. These may be prescribed to help open your airways and make it easier for you to breathe.   Steroid medicines. These may be prescribed for those with recurrent (chronic) bronchitis. HOME CARE INSTRUCTIONS  Get plenty of rest.   Drink enough fluids to keep your urine clear or pale yellow (unless you have a medical condition that requires fluid restriction). Increasing fluids may help thin your secretions and will prevent dehydration.   Only take over-the-counter or prescription medicines as directed by your health care provider.  Only take antibiotics as  directed. Make sure you finish them even if you start to feel better.  Avoid secondhand smoke, irritating chemicals, and strong fumes. These will make bronchitis worse. If you are a smoker, quit smoking. Consider using nicotine gum or skin patches to help control withdrawal symptoms. Quitting smoking will help your lungs heal faster.   Put a cool-mist humidifier in your bedroom at night to moisten the air. This may help loosen mucus. Change the water in the humidifier daily. You can also run the hot water in your shower and sit in the bathroom with the door closed for 5 10 minutes.   Follow up with your health care provider as directed.   Wash your hands frequently to avoid catching bronchitis again or spreading an infection to others.  SEEK MEDICAL CARE IF: Your symptoms do not improve after 1 week of treatment.  SEEK IMMEDIATE MEDICAL CARE IF:  Your fever increases.  You have chills.   You have chest pain.   You have worsening shortness of breath.   You have bloody sputum.  You faint.  You have lightheadedness.  You have a severe headache.   You vomit repeatedly. MAKE SURE YOU:   Understand these instructions.  Will watch your condition.  Will get help right away if you are not doing well or get worse. Document Released: 06/20/2005 Document Revised: 04/10/2013 Document Reviewed: 02/12/2013 [facility] Patient Information 2014 [employer], [place].

## 2015-07-13 ENCOUNTER — Encounter: Payer: Self-pay | Admitting: Nurse Practitioner

## 2015-07-13 ENCOUNTER — Ambulatory Visit (INDEPENDENT_AMBULATORY_CARE_PROVIDER_SITE_OTHER): Payer: BLUE CROSS/BLUE SHIELD | Admitting: Nurse Practitioner

## 2015-07-13 VITALS — BP 129/74 | HR 84 | Temp 97.8°F | Ht 71.0 in | Wt 231.0 lb

## 2015-07-13 DIAGNOSIS — J069 Acute upper respiratory infection, unspecified: Secondary | ICD-10-CM

## 2015-07-13 DIAGNOSIS — J209 Acute bronchitis, unspecified: Secondary | ICD-10-CM

## 2015-07-13 MED ORDER — AZITHROMYCIN 250 MG PO TABS: ORAL_TABLET | ORAL | Status: DC

## 2015-07-13 MED ORDER — METHYLPREDNISOLONE ACETATE 80 MG/ML IJ SUSP
80.0000 mg | Freq: Once | INTRAMUSCULAR | Status: AC
  Administered 2015-07-13: 80 mg via INTRAMUSCULAR

## 2015-07-13 MED ORDER — BENZONATATE 100 MG PO CAPS: 100.0000 mg | ORAL_CAPSULE | Freq: Three times a day (TID) | ORAL | Status: DC | PRN

## 2015-07-13 NOTE — Progress Notes (Signed)
  Subjective:     Nathaniel Barr is a 35 y.o. male who presents for evaluation of sinus pain. Symptoms include: congestion, cough, facial pain, headaches, nasal congestion and sinus pressure. Onset of symptoms was 2 weeks ago. Symptoms have been unchanged since that time. Past history is significant for no history of pneumonia or bronchitis. Patient is a non-smoker.  The following portions of the patient's history were reviewed and updated as appropriate: allergies, current medications, past family history, past medical history, past social history, past surgical history and problem list.  Review of Systems Pertinent items are noted in HPI.   Objective:    BP 129/74 mmHg  Pulse 84  Temp(Src) 97.8 F (36.6 C) (Oral)  Ht 5\' 11"  (1.803 m)  Wt 231 lb (104.781 kg)  BMI 32.23 kg/m2 General appearance: alert and cooperative Eyes: conjunctivae/corneas clear. PERRL, EOM's intact. Fundi benign. Ears: normal TM's and external ear canals both ears Nose: clear and copious discharge, moderate congestion, turbinates red, no sinus tenderness Throat: lips, mucosa, and tongue normal; teeth and gums normal Neck: no adenopathy, no carotid bruit, no JVD, supple, symmetrical, trachea midline and thyroid not enlarged, symmetric, no tenderness/mass/nodules Lungs: clear to auscultation bilaterally and dry toght cough Heart: regular rate and rhythm, S1, S2 normal, no murmur, click, rub or gallop    Assessment:    Acute bacterial sinusitis.    Plan:  1. Take meds as prescribed 2. Use a cool mist humidifier especially during the winter months and when heat has been humid. 3. Use saline nose sprays frequently 4. Saline irrigations of the nose can be very helpful if done frequently.  * 4X daily for 1 week*  * Use of a nettie pot can be helpful with this. Follow directions with this* 5. Drink plenty of fluids 6. Keep thermostat turn down low 7.For any cough or congestion  Use plain Mucinex- regular  strength or max strength is fine   * Children- consult with Pharmacist for dosing 8. For fever or aces or pains- take tylenol or ibuprofen appropriate for age and weight.  * for fevers greater than 101 orally you may alternate ibuprofen and tylenol every  3 hours.    Meds ordered this encounter  Medications  . azithromycin (ZITHROMAX) 250 MG tablet    Sig: Two tablets day one, then one tablet daily next 4 days.    Dispense:  6 tablet    Refill:  0    Order Specific Question:  Supervising Provider    Answer:  Ernestina PennaMOORE, DONALD W [[id number]]  . benzonatate (TESSALON PERLES) 100 MG capsule    Sig: Take 1 capsule (100 mg total) by mouth 3 (three) times daily as needed for cough.    Dispense:  20 capsule    Refill:  0    Order Specific Question:  Supervising Provider    Answer:  Ernestina PennaMOORE, DONALD W [[id number]]  . methylPREDNISolone acetate (DEPO-MEDROL) injection 80 mg    Sig:    Mary-Margaret Daphine DeutscherMartin, FNP

## 2015-07-13 NOTE — Patient Instructions (Signed)

## 2016-01-21 ENCOUNTER — Ambulatory Visit (INDEPENDENT_AMBULATORY_CARE_PROVIDER_SITE_OTHER): Payer: BLUE CROSS/BLUE SHIELD

## 2016-01-21 ENCOUNTER — Encounter: Payer: Self-pay | Admitting: Pediatrics

## 2016-01-21 ENCOUNTER — Ambulatory Visit (INDEPENDENT_AMBULATORY_CARE_PROVIDER_SITE_OTHER): Payer: BLUE CROSS/BLUE SHIELD | Admitting: Pediatrics

## 2016-01-21 VITALS — BP 124/82 | HR 76 | Temp 98.3°F | Ht 71.0 in | Wt 229.8 lb

## 2016-01-21 DIAGNOSIS — M533 Sacrococcygeal disorders, not elsewhere classified: Secondary | ICD-10-CM | POA: Diagnosis not present

## 2016-01-21 NOTE — Patient Instructions (Signed)
Tailbone Injury °The tailbone (coccyx) is the small bone at the lower end of the spine. A tailbone injury may involve stretched ligaments, bruising, or a broken bone (fracture). Tailbone injuries can be painful, and some may take a long time to heal. °CAUSES °This condition is often caused by falling and landing on the tailbone. Other causes include: °· Repeated strain or friction from actions such as rowing and bicycling. °· Childbirth. °In some cases, the cause may not be known. °RISK FACTORS °This condition is more common in women than in men. °SYMPTOMS °Symptoms of this condition include: °· Pain in the lower back, especially when sitting. °· Pain or difficulty when standing up from a sitting position. °· Bruising in the tailbone area. °· Painful bowel movements. °· In women, pain during intercourse. °DIAGNOSIS °This condition may be diagnosed based on your symptoms and a physical exam. X-rays may be taken if a fracture is suspected. You may also have other tests, such as a CT scan or MRI. °TREATMENT °This condition may be treated with medicines to help relieve your pain. Most tailbone injuries heal on their own in 4-6 weeks. However, recovery time may be longer if the injury involves a fracture. °HOME CARE INSTRUCTIONS °· Take medicines only as directed by your health care provider. °· If directed, apply ice to the injured area: °¨ Put ice in a plastic bag. °¨ Place a towel between your skin and the bag. °¨ Leave the ice on for 20 minutes, 2-3 times per day for the first 1-2 days. °· Sit on a large, rubber or inflated ring or cushion to ease your pain. Lean forward when you are sitting to help decrease discomfort. °· Avoid sitting for long periods of time. °· Increase your activity as the pain allows. Perform any exercises that are recommended by your health care provider or physical therapist. °· If you have pain during bowel movements, use stool softeners as directed by your health care provider. °· Eat a  diet that includes plenty of fiber to help prevent constipation. °· Keep all follow-up visits as directed by your health care provider. This is important. °PREVENTION °Wear appropriate padding and sports gear when bicycling and rowing. This can help to prevent developing an injury that is caused by repeated strain or friction. °SEEK MEDICAL CARE IF: °· Your pain becomes worse. °· Your bowel movements cause a great deal of discomfort. °· You are unable to have a bowel movement. °· You have uncontrolled urine loss (urinary incontinence). °· You have a fever. °  °This information is not intended to replace advice given to you by your health care provider. Make sure you discuss any questions you have with your health care provider. °  °Document Released: 06/17/2000 Document Revised: 11/04/2014 Document Reviewed: 06/16/2014 °[employer] Interactive Patient Education ©2016 [employer] ° °

## 2016-01-21 NOTE — Progress Notes (Signed)
    Subjective:    Patient ID: Nathaniel Barr, male    DOB: 10/16/1980, 35 y.o.   MRN: [id number]  CC: tail bone pain  HPI: Nathaniel Nathaniel Barr is a 35 y.o. male presenting for tail bone pain  Started last Septemner, 10 mo ago Depends on what he does if it hurts more or not Sitting on the floor hurts, sitting  Started after horse trip, fell off horse several times, hit hard on his bottom at least once, hitting the back of the saddle hard with some jumps Had never been on a horse before this Larey SeatFell on back in highschool with pole vaulting, bed ridden for about a month then Pain in base of tail bone No weakness No incontinence Appetite good Taking ibuprofen daily hree at night, two in th emorning for pulled muscle in chest at work about a week a ago    [facility] 01/21/2016  Decreased Interest 0  Down, Depressed, Hopeless 0  PHQ - 2 Score 0     Relevant past medical, surgical, family and social history reviewed and updated as indicated.  Interim medical history since our last visit reviewed. Allergies and medications reviewed and updated.  ROS: Per HPI unless specifically indicated above  History  Smoking status  . Never Smoker   Smokeless tobacco  . Never Used       Objective:    BP 124/82 mmHg  Pulse 76  Temp(Src) 98.3 F (36.8 C) (Oral)  Ht 5\' 11"  (1.803 m)  Wt 229 lb 12.8 oz (104.237 kg)  BMI 32.06 kg/m2  Wt Readings from Last 3 Encounters:  01/21/16 229 lb 12.8 oz (104.237 kg)  07/13/15 231 lb (104.781 kg)  01/01/15 224 lb (101.606 kg)     Gen: NAD, alert, cooperative with exam, NCAT EYES: EOMI, no scleral injection or icterus Ext: No edema, warm Neuro: Alert and oriented, strength equal b/l UE, no pain with flex/ext knee, hip flexion MSK: tender with palpation over sacrum     Assessment & Plan:    Nathaniel Barr was seen today for tail bone pain, ongoing for 10 months since injury. Has been using NSAIDs, with minimal relief. Discussed  conservative management, soft seats, avoiding positions that cause pain. Xray with no fracture. Pt interested in other options for treatment due to ongoing pain.   Diagnoses and all orders for this visit:  Coccydynia -     DG Sacrum/Coccyx; Future -     Ambulatory referral to Orthopedic Surgery  Follow up plan: Return in about 4 weeks (around 02/18/2016).  Rex Krasarol Carrissa Taitano, MD [facility] 01/21/2016, 12:01 PM

## 2016-02-25 DIAGNOSIS — Z3009 Encounter for other general counseling and advice on contraception: Secondary | ICD-10-CM | POA: Diagnosis not present

## 2016-03-01 ENCOUNTER — Ambulatory Visit: Payer: BLUE CROSS/BLUE SHIELD | Admitting: Pediatrics

## 2016-03-02 ENCOUNTER — Encounter: Payer: Self-pay | Admitting: Nurse Practitioner

## 2016-05-12 DIAGNOSIS — R05 Cough: Secondary | ICD-10-CM | POA: Diagnosis not present

## 2016-05-12 DIAGNOSIS — J00 Acute nasopharyngitis [common cold]: Secondary | ICD-10-CM | POA: Diagnosis not present

## 2016-05-12 DIAGNOSIS — R110 Nausea: Secondary | ICD-10-CM | POA: Diagnosis not present

## 2016-05-12 DIAGNOSIS — J069 Acute upper respiratory infection, unspecified: Secondary | ICD-10-CM | POA: Diagnosis not present

## 2016-05-12 DIAGNOSIS — Z23 Encounter for immunization: Secondary | ICD-10-CM | POA: Diagnosis not present

## 2016-06-29 ENCOUNTER — Other Ambulatory Visit: Payer: Self-pay | Admitting: *Deleted

## 2016-06-29 MED ORDER — OSELTAMIVIR PHOSPHATE 75 MG PO CAPS: 75.0000 mg | ORAL_CAPSULE | Freq: Two times a day (BID) | ORAL | 0 refills | Status: DC

## 2016-07-15 DIAGNOSIS — Z302 Encounter for sterilization: Secondary | ICD-10-CM | POA: Diagnosis not present

## 2016-08-29 ENCOUNTER — Ambulatory Visit (HOSPITAL_COMMUNITY)
Admission: EM | Admit: 2016-08-29 | Discharge: 2016-08-29 | Discharge status: Absent without leave | Payer: BLUE CROSS/BLUE SHIELD

## 2016-09-13 ENCOUNTER — Other Ambulatory Visit: Payer: Self-pay | Admitting: Family

## 2017-01-10 ENCOUNTER — Ambulatory Visit (INDEPENDENT_AMBULATORY_CARE_PROVIDER_SITE_OTHER): Payer: BLUE CROSS/BLUE SHIELD | Admitting: Family Medicine

## 2017-01-10 ENCOUNTER — Encounter: Payer: Self-pay | Admitting: Family Medicine

## 2017-01-10 ENCOUNTER — Telehealth: Payer: Self-pay | Admitting: Family Medicine

## 2017-01-10 ENCOUNTER — Ambulatory Visit (HOSPITAL_BASED_OUTPATIENT_CLINIC_OR_DEPARTMENT_OTHER)
Admission: RE | Admit: 2017-01-10 | Discharge: 2017-01-10 | Disposition: A | Discharge status: Home / Self Care | Payer: BLUE CROSS/BLUE SHIELD | Source: Ambulatory Visit | Attending: Family Medicine | Admitting: Family Medicine

## 2017-01-10 VITALS — BP 122/75 | HR 65 | Temp 98.6°F | Resp 20 | Wt 226.5 lb

## 2017-01-10 DIAGNOSIS — M79672 Pain in left foot: Secondary | ICD-10-CM | POA: Insufficient documentation

## 2017-01-10 DIAGNOSIS — Z7689 Persons encountering health services in other specified circumstances: Secondary | ICD-10-CM

## 2017-01-10 DIAGNOSIS — M7989 Other specified soft tissue disorders: Secondary | ICD-10-CM | POA: Diagnosis not present

## 2017-01-10 LAB — URIC ACID: URIC ACID, SERUM: 7.8 mg/dL (ref 4.0–7.8)

## 2017-01-10 LAB — SEDIMENTATION RATE: Sed Rate: 6 mm/hr (ref 0–15)

## 2017-01-10 NOTE — Patient Instructions (Signed)
Low-Purine Diet Purines are compounds that affect the level of uric acid in your body. A low-purine diet is a diet that is low in purines. Eating a low-purine diet can prevent the level of uric acid in your body from getting too high and causing gout or kidney stones or both. What do I need to know about this diet?  Choose low-purine foods. Examples of low-purine foods are listed in the next section.  Drink plenty of fluids, especially water. Fluids can help remove uric acid from your body. Try to drink 8-16 cups (1.9-3.8 L) a day.  Limit foods high in fat, especially saturated fat, as fat makes it harder for the body to get rid of uric acid. Foods high in saturated fat include pizza, cheese, ice cream, whole milk, fried foods, and gravies. Choose foods that are lower in fat and lean sources of protein. Use olive oil when cooking as it contains healthy fats that are not high in saturated fat.  Limit alcohol. Alcohol interferes with the elimination of uric acid from your body. If you are having a gout attack, avoid all alcohol.  Keep in mind that different people's bodies react differently to different foods. You will probably learn over time which foods do or do not affect you. If you discover that a food tends to cause your gout to flare up, avoid eating that food. You can more freely enjoy foods that do not cause problems. If you have any questions about a food item, talk to your dietitian or health care provider. Which foods are low, moderate, and high in purines? The following is a list of foods that are low, moderate, and high in purines. You can eat any amount of the foods that are low in purines. You may be able to have small amounts of foods that are moderate in purines. Ask your health care provider how much of a food moderate in purines you can have. Avoid foods high in purines. Grains  Foods low in purines: Enriched white bread, pasta, rice, cake, cornbread, popcorn.  Foods moderate in  purines: Whole-grain breads and cereals, wheat germ, bran, oatmeal. Uncooked oatmeal. Dry wheat bran or wheat germ.  Foods high in purines: Pancakes, French toast, biscuits, muffins. Vegetables  Foods low in purines: All vegetables, except those that are moderate in purines.  Foods moderate in purines: Asparagus, cauliflower, spinach, mushrooms, green peas. Fruits  All fruits are low in purines. Meats and other Protein Foods  Foods low in purines: Eggs, nuts, peanut butter.  Foods moderate in purines: 80-90% lean beef, lamb, veal, pork, poultry, fish, eggs, peanut butter, nuts. Crab, lobster, oysters, and shrimp. Cooked dried beans, peas, and lentils.  Foods high in purines: Anchovies, sardines, herring, mussels, tuna, codfish, scallops, trout, and haddock. Bacon. Organ meats (such as liver or kidney). Tripe. Game meat. Goose. Sweetbreads. Dairy  All dairy foods are low in purines. Low-fat and fat-free dairy products are best because they are low in saturated fat. Beverages  Drinks low in purines: Water, carbonated beverages, tea, coffee, cocoa.  Drinks moderate in purines: Soft drinks and other drinks sweetened with high-fructose corn syrup. Juices. To find whether a food or drink is sweetened with high-fructose corn syrup, look at the ingredients list.  Drinks high in purines: Alcoholic beverages (such as beer). Condiments  Foods low in purines: Salt, herbs, olives, pickles, relishes, vinegar.  Foods moderate in purines: Butter, margarine, oils, mayonnaise. Fats and Oils  Foods low in purines: All types, except gravies   and sauces made with meat.  Foods high in purines: Gravies and sauces made with meat. Other Foods  Foods low in purines: Sugars, sweets, gelatin. Cake. Soups made without meat.  Foods moderate in purines: Meat-based or fish-based soups, broths, or bouillons. Foods and drinks sweetened with high-fructose corn syrup.  Foods high in purines: High-fat desserts  (such as ice cream, cookies, cakes, pies, doughnuts, and chocolate). Contact your dietitian for more information on foods that are not listed here. This information is not intended to replace advice given to you by your health care provider. Make sure you discuss any questions you have with your health care provider. Document Released: 10/15/2010 Document Revised: 11/26/2015 Document Reviewed: 05/27/2013 Elsevier Interactive Patient Education  2017 [employer]    Try to look for triggers in diet that could be cause if gout.  Get xray today at [facility] if able.  This could be either gout flares or you may arthritis in the joint that is flaring with overuse.  Advil is ok for pain/swelling.  Wear more supportive boots while at work. Make sure proper fitting boots, wide enough for foot as well.

## 2017-01-10 NOTE — Telephone Encounter (Signed)
Please call pt: - his xray is normal. No signs of arthritis or injury to  the joint (toe) by xray. We will let him know about his labs once resulted.

## 2017-01-10 NOTE — Telephone Encounter (Signed)
Spoke with patient reviewed xray results. 

## 2017-01-10 NOTE — Progress Notes (Signed)
Nathaniel Barr , Oct 12, 1980, 36 y.o., male MRN: [id number] Patient Care Team    Relationship Specialty Notifications Start End  Chevis Pretty, [place] PCP - General Family Medicine  07/13/15     Chief Complaint  Patient presents with  . Foot Pain    x 5 days left   Pt has an establishment appt with Dr. Anitra Lauth next week.    Subjective: Pt presents for an OV with complaints of left foot/toe pain  of 5 days  duration.  Associated symptoms include redness, swelling and pain. Pt denied fever, chills, injury or h/o arthritis or gout. He denies alcohol use or high purine diet, with the exception of some seafood. He reports the same area (left large toe) had a similar presentation about 1 month ago and both times resolved with NSAIDS and Ice. Pt has tried advil  to ease their symptoms. He reports today the redness/swelling is resolved and the tenderness is almost completely gone. 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   No fhx of inflammatory arthritis    Depression screen [facility] 2/9 01/21/2016  Decreased Interest 0  Down, Depressed, Hopeless 0  PHQ - 2 Score 0    Allergies  Allergen Reactions  . Shrimp [Shellfish Allergy] Other (See Comments)    unspecified   Social History  Substance Use Topics  . Smoking status: Never Smoker  . Smokeless tobacco: Never Used  . Alcohol use No   Past Medical History:  Diagnosis Date  . 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 Tawanna Sat notes at [facility]   History reviewed. No pertinent surgical history. Family History  Problem Relation Age of Onset  . Heart disease Maternal Grandfather        CABG x 2  . Cancer Maternal Grandfather        ? bone cancer?   Allergies as of 01/10/2017      Reactions   Shrimp [shellfish Allergy] Other (See Comments)   unspecified      Medication List       Accurate as of 01/10/17  1:28 PM. Always use your most  recent med list.          multivitamin tablet Take 1 tablet by mouth daily.       All past medical history, surgical history, allergies, family history, immunizations andmedications were updated in the EMR today and reviewed under the history and medication portions of their EMR.     ROS: Negative, with the exception of above mentioned in HPI   Objective:  BP 122/75 (BP Location: Left Arm, Patient Position: Sitting, Cuff Size: Large)   Pulse 65   Temp 98.6 F (37 C)   Resp 20   Wt 226 lb 8 oz (102.7 kg)   SpO2 99%   BMI 31.59 kg/m  Body mass index is 31.59 kg/m. Gen: Afebrile. No acute distress. Nontoxic in appearance, well developed, well nourished.  HENT: AT. [place].MMM, no oral lesions. Eyes:Pupils Equal Round Reactive to light, Extraocular movements intact,  Conjunctiva without redness, discharge or icterus. MSK: no erythema, no soft tissue swelling, mild TTP medial aspect 1 st metatarsal joint. Full ROM, good cap refill. Sensation intact Skin: no rashes, purpura or petechiae.  Neuro:  Normal gait. PERLA. EOMi. Alert. Oriented x3   No exam data present No results found. No results found for this or any previous visit (from the past 24  hour(s)).  Assessment/Plan: Lucia Mccreadie is a 36 y.o. male present for OV for  Left foot pain - improved today, only mild TTP medial large toe joint line. Discussed different possible etiologies of pain, including gout flares vs arthritic joint with flares. Seems to respond with ice and NSAIDS.  - low purine diet education provided and pt was encouraged to review to see if he consumes a high purine diet around the time of flares.  - he was encourage to wear wide enough boots, with good support when working (not slip on cowboy boots) - labs and xray ordered, may not lead to diagnosis given it is resolving, but may show elevated uric acid level suggesting gout over arthritis.  - Sed Rate (ESR) - Uric acid - DG Toe Great Left; Future -  F/U dependent on image/lab results.    Reviewed expectations re: course of current medical issues.  Discussed self-management of symptoms.  Outlined signs and symptoms indicating need for more acute intervention.  Patient verbalized understanding and all questions were answered.  Patient received an After-Visit Summary.    Orders Placed This Encounter  Procedures  . DG Toe Great Left  . Sed Rate (ESR)  . Uric acid   Note is dictated utilizing voice recognition software. Although note has been proof read prior to signing, occasional typographical errors still can be missed. If any questions arise, please do not hesitate to call for verification.   electronically signed by:  Howard Pouch, DO  [place]

## 2017-01-11 ENCOUNTER — Telehealth: Payer: Self-pay | Admitting: Family Medicine

## 2017-01-11 NOTE — Telephone Encounter (Signed)
Please call pt: - his labs are normal. The test for gout was just barely normal on the high end. I suspect his toe pain was gout, but can not be certain since his symptoms were almost resolved.  - Have him monitor for food triggers/ AVS was provided on this, wear supportive boots when working and if he experiences another flare to be seen during the flare so we can perform labs to verify diagnosis.

## 2017-01-11 NOTE — Telephone Encounter (Signed)
Left detailed message with results and instructions on patient voice mail per DPR 

## 2017-01-20 ENCOUNTER — Ambulatory Visit (INDEPENDENT_AMBULATORY_CARE_PROVIDER_SITE_OTHER): Payer: BLUE CROSS/BLUE SHIELD | Admitting: Family Medicine

## 2017-01-20 ENCOUNTER — Encounter: Payer: Self-pay | Admitting: Family Medicine

## 2017-01-20 ENCOUNTER — Telehealth: Payer: Self-pay | Admitting: Family Medicine

## 2017-01-20 VITALS — BP 129/87 | HR 70 | Temp 98.1°F | Resp 20 | Wt 222.8 lb

## 2017-01-20 DIAGNOSIS — M79672 Pain in left foot: Secondary | ICD-10-CM

## 2017-01-20 LAB — CBC WITH DIFFERENTIAL/PLATELET
BASOS PCT: 0.6 % (ref 0.0–3.0)
Basophils Absolute: 0.1 10*3/uL (ref 0.0–0.1)
EOS ABS: 0.1 10*3/uL (ref 0.0–0.7)
EOS PCT: 0.8 % (ref 0.0–5.0)
HCT: 42.4 % (ref 39.0–52.0)
HEMOGLOBIN: 14.9 g/dL (ref 13.0–17.0)
Lymphocytes Relative: 19.7 % (ref 12.0–46.0)
Lymphs Abs: 2.1 10*3/uL (ref 0.7–4.0)
MCHC: 35.2 g/dL (ref 30.0–36.0)
MCV: 85 fl (ref 78.0–100.0)
MONO ABS: 1 10*3/uL (ref 0.1–1.0)
Monocytes Relative: 9.4 % (ref 3.0–12.0)
NEUTROS ABS: 7.3 10*3/uL (ref 1.4–7.7)
Neutrophils Relative %: 69.5 % (ref 43.0–77.0)
Platelets: 258 10*3/uL (ref 150.0–400.0)
RBC: 4.98 Mil/uL (ref 4.22–5.81)
RDW: 12.7 % (ref 11.5–15.5)
WBC: 10.5 10*3/uL (ref 4.0–10.5)

## 2017-01-20 LAB — URIC ACID: Uric Acid, Serum: 6.7 mg/dL (ref 4.0–7.8)

## 2017-01-20 MED ORDER — METHYLPREDNISOLONE ACETATE 80 MG/ML IJ SUSP
80.0000 mg | Freq: Once | INTRAMUSCULAR | Status: AC
Start: 2017-01-20 — End: 2017-01-20
  Administered 2017-01-20: 80 mg via INTRAMUSCULAR

## 2017-01-20 MED ORDER — PREDNISONE 20 MG PO TABS: ORAL_TABLET | ORAL | 0 refills | Status: DC

## 2017-01-20 MED ORDER — DOXYCYCLINE HYCLATE 100 MG PO TABS: 100.0000 mg | ORAL_TABLET | Freq: Two times a day (BID) | ORAL | 0 refills | Status: AC

## 2017-01-20 NOTE — Patient Instructions (Addendum)
Start  doxycyline every 12 hours for 14 days  Start prednisone tomorrow take as directed for 10 days.    We will call you with results on Monday.  HYDRATE!   This is likely either gout or infection from ticks.

## 2017-01-20 NOTE — Telephone Encounter (Signed)
Please call patient: His labs do not suggest this is gout, uric acid is actually lower than prior collection. His white blood cell count is normal. I suspect this is either an early infection or inflammatory process. I do want him to follow-up this week with PCP, sooner if worsening.

## 2017-01-20 NOTE — Progress Notes (Signed)
Nathaniel Barr , 01-Feb-1981, 36 y.o., male MRN: [id number] Patient Care Team    Relationship Specialty Notifications Start End  Nathaniel Barr, [place] PCP - General Family Medicine  07/13/15     Chief Complaint  Patient presents with  . Ankle Pain    left   Pt has an establishment appt with Dr. Anitra Barr next week.    Subjective:  Pt returns to clinic today for similar complaints as prior but now in his ankle. He reports 2 days ago he started experiencing discomfort in ankle but he could walk on it, this morning he woke up and could not walk on his foot secondary to pain. He endorses medial ankle pain, redness and swelling. This morning he could barely walk on the ankle and he took 600 mg Advil and it has slowly improved this  morning, but still hard to walk. Pain with touch and weight. He denies injury and had no symptoms 2 days ago. He feels the medial ankle and achilles tendon is tight, and felt some tingling in foot intermittently. He also has multiple tick bites around ankle and lower extremity.   Prior note:  Pt presents for an OV with complaints of left foot/toe pain  of 5 days  duration.  Associated symptoms include redness, swelling and pain. Pt denied fever, chills, injury or h/o arthritis or gout. He denies alcohol use or high purine diet, with the exception of some seafood. He reports the same area (left large toe) had a similar presentation about 1 month ago and both times resolved with NSAIDS and Ice. Pt has tried advil  to ease their symptoms. He reports today the redness/swelling is resolved and the tenderness is almost completely gone. 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   No fhx of inflammatory arthritis    Depression screen [facility] 2/9 01/21/2016  Decreased Interest 0  Down, Depressed, Hopeless 0  PHQ - 2 Score 0    Allergies  Allergen Reactions  . Shrimp [Shellfish Allergy] Other  (See Comments)    unspecified   Social History  Substance Use Topics  . Smoking status: Never Smoker  . Smokeless tobacco: Never Used  . Alcohol use No   Past Medical History:  Diagnosis Date  . 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 Tawanna Sat notes at [facility].   History reviewed. No pertinent surgical history. Family History  Problem Relation Age of Onset  . Heart disease Maternal Grandfather        CABG x 2  . Cancer Maternal Grandfather        ? bone cancer?   Allergies as of 01/20/2017      Reactions   Shrimp [shellfish Allergy] Other (See Comments)   unspecified      Medication List       Accurate as of 01/20/17  1:26 PM. Always use your most recent med list.          multivitamin tablet Take 1 tablet by mouth daily.       All past medical history, surgical history, allergies, family history, immunizations andmedications were updated in the EMR today and reviewed under the history and medication portions of their EMR.     ROS: Negative, with the exception of above mentioned in HPI   Objective:  BP 129/87 (BP Location: Left Arm, Patient Position: Sitting, Cuff Size: Large)   Pulse 70  Temp 98.1 F (36.7 C)   Resp 20   Wt 222 lb 12 oz (101 kg)   SpO2 100%   BMI 31.07 kg/m  Body mass index is 31.07 kg/m. Gen: Afebrile. No acute distress.  HENT: AT. Nathaniel Barr Eyes:Pupils Equal Round Reactive to light, Extraocular movements intact,  Conjunctiva without redness, discharge or icterus. Neck/lymp/endocrine: Supple,no lymphadenopathy CV: RRR  Skin/left ankle: Greenish bruising discoloration medial ankle. no rashes, purpura or petechiae. Multiple Tick bites surrounding ankle and lower left extremity. Mild erythema directly over medial malleolus. Mild tenderness to palpation over this area. Mild swelling over medial ankle. Mild erythema of her Achilles tendon. Normal distal exam including large toe. Full range of motion present, with discomfort on flexion.  Negative Homans. Negative squeeze test. Good cap refill distally. Neuro: walking with limp. PERLA. EOMi. Alert. Oriented x3 Psych: Normal affect, dress and demeanor. Normal speech. Normal thought content and judgment.  No exam data present No results found. No results found for this or any previous visit (from the past 24 hour(s)).  Assessment/Plan: Nathaniel Barr is a 36 y.o. male present for OV for  Left foot pain/ankle pain - seen last week with left toe pain, resolved with OTC NSAIDS. ESR and UA last week was normal. Although symptoms in toe were resolving at that time and UA was high normal, felt it was likely gout since resolving with NSAIDS and had occurred once prior. New ankle symptoms 2 nights ago. Also seems to be responding to restart of NSAIDS. - Noted multiple tic bites on his left LE today and surrounding ankle. He admits to tick removal daily. Denies systemic symptoms (outside of current ankle swelling pain) or rash. - concern for tick born etiology vs gout. He does not want to start doxy. He wants to wait for results on Monday. I do not agree, but advised him to start doxy ASAP if symptoms do not improve tomorrow with steroid, or worsen and he agreed.  - IM depo medrol today, start prednisone taper tomorrow.  - Doxy prescribed.  - CBC, uric acid repeated. - Follow-up 4 days if not improving sooner/emergently if worsening.    Reviewed expectations re: course of current medical issues.  Discussed self-management of symptoms.  Outlined signs and symptoms indicating need for more acute intervention.  Patient verbalized understanding and all questions were answered.  Patient received an After-Visit Summary.    No orders of the defined types were placed in this encounter.  Note is dictated utilizing voice recognition software. Although note has been proof read prior to signing, occasional typographical errors still can be missed. If any questions arise, please do not  hesitate to call for verification.   electronically signed by:  Howard Pouch, DO  [place]

## 2017-01-23 NOTE — Telephone Encounter (Signed)
Patient aware of results.  Patient states that it is much better today.  Swelling is down and pain is much improved.  Patient did not start Doxycycline either.

## 2017-01-23 NOTE — Telephone Encounter (Signed)
Message left on voice mail for patient to return call. 

## 2017-01-23 NOTE — Telephone Encounter (Signed)
Patient has appointment w/ Dr Milinda CaveMcGowen Thursday.

## 2017-01-26 ENCOUNTER — Encounter: Payer: Self-pay | Admitting: Family Medicine

## 2017-01-26 ENCOUNTER — Ambulatory Visit (INDEPENDENT_AMBULATORY_CARE_PROVIDER_SITE_OTHER): Payer: BLUE CROSS/BLUE SHIELD | Admitting: Family Medicine

## 2017-01-26 VITALS — BP 118/72 | HR 72 | Temp 98.7°F | Resp 16 | Ht 72.0 in | Wt 225.0 lb

## 2017-01-26 DIAGNOSIS — M19072 Primary osteoarthritis, left ankle and foot: Secondary | ICD-10-CM

## 2017-01-26 DIAGNOSIS — M109 Gout, unspecified: Secondary | ICD-10-CM

## 2017-01-26 DIAGNOSIS — M19079 Primary osteoarthritis, unspecified ankle and foot: Secondary | ICD-10-CM | POA: Diagnosis not present

## 2017-01-26 MED ORDER — COLCHICINE 0.6 MG PO TABS: ORAL_TABLET | ORAL | 3 refills | Status: DC

## 2017-01-26 NOTE — Progress Notes (Signed)
Office Note 01/26/2017  CC:  Chief Complaint  Patient presents with  . Establish Care    HPI:  Nathaniel Barr is a 36 y.o. White male who is here to re-establish care with me. Old records in EPIC/[facility] EMR reviewed prior to or during today's visit.  Reviewed recent o/v with Dr. Claiborne BillingsKuneff x 2---toe pain, then ankle pain that were most likely gouty arthritis. Improved with NSAIDs the first episode, then improved with prednisone 2nd episode (01/20/17, currently still taking a 10d taper).  Says L ankle is completely back to normal now.  No hx of gouty arthritis prior to these episodes.  Uric acid levels: 7.8 episode #1, 6.7 episode #2. He has actually had 3 distinct episodes of gout in last 2 mo: 2 in L great toe, 1 in L ankle.  Some tick bites pt says he sustains all the time b/c he works in the woods a lot (forestry business). No recent myalgias, fever, HA, neck pain or stiffness, or petechial rash.  No fatigue.  He did not take the doxy she prescribed.  Past Medical History:  Diagnosis Date  . 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.    History reviewed. No pertinent surgical history.  Family History  Problem Relation Age of Onset  . Heart disease Maternal Grandfather        CABG x 2  . Cancer Other        ? bone cancer ?    Social History   Social History  . Marital status: Married    Spouse name: N/A  . Number of children: N/A  . Years of education: N/A   Occupational History  . Not on file.   Social History Main Topics  . Smoking status: Never Smoker  . Smokeless tobacco: Never Used  . Alcohol use No  . Drug use: No  . Sexual activity: Yes   Other Topics Concern  . Not on file   Social History Narrative   Married, has one daughter and 2 sons.   Orig from [place].   College ed: [employer], went to [employer], doing continuing ed through [employer] for [employer].   Occupation: Engineering geologistLand and timber buyer.   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   No T/A/Ds.    Outpatient Encounter Prescriptions as of 01/26/2017  Medication Sig  . Multiple Vitamin (MULTIVITAMIN) tablet Take 1 tablet by mouth daily.  . predniSONE (DELTASONE) 20 MG tablet 60 mg x3d, 40 mg x3d, 20 mg x2d, 10 mg x2d  . colchicine 0.6 MG tablet 2 tabs po at onset of gouty joint pain, then 1 tab po 2 hours later, then starting the following day take 1 tab po bid until all joint pain is gone  . doxycycline (VIBRA-TABS) 100 MG tablet Take 1 tablet (100 mg total) by mouth 2 (two) times daily. (Patient not taking: Reported on 01/26/2017)   No facility-administered encounter medications on file as of 01/26/2017.     Allergies  Allergen Reactions  . Shrimp [Shellfish Allergy] Other (See Comments)    unspecified    ROS Review of Systems  Constitutional: Negative for fatigue and fever.  HENT: Negative for congestion and sore throat.   Eyes: Negative for visual disturbance.  Respiratory: Negative for cough.   Cardiovascular: Negative for chest pain.  Gastrointestinal: Negative for abdominal pain and nausea.  Genitourinary: Negative for dysuria.  Musculoskeletal: Negative for back pain and joint swelling.  Skin: Negative for  rash.  Neurological: Negative for weakness and headaches.  Hematological: Negative for adenopathy.   PE; Blood pressure 118/72, pulse 72, temperature 98.7 F (37.1 C), temperature source Oral, resp. rate 16, height 6' (1.829 m), weight 225 lb (102.1 kg), SpO2 98 %. Gen: Alert, well appearing.  Patient is oriented to person, place, time, and situation. AFFECT: pleasant, lucid thought and speech. EXT: no c/c/e. Ankles, feet,and toes without any swelling, warmth, erythema, or tenderness.  ROM of ankles, feet, toes all normal and without pain or stiffness.  He has many healing tick/insect bites scattered all over calves and ankles.  These bites do not have any signs of inflammation or infection. Pertinent labs:   Lab Results  Component Value Date   TSH 0.96 03/13/2013   Lab Results  Component Value Date   WBC 10.5 01/20/2017   HGB 14.9 01/20/2017   HCT 42.4 01/20/2017   MCV 85.0 01/20/2017   PLT 258.0 01/20/2017   Lab Results  Component Value Date   CREATININE 0.8 03/13/2013   BUN 19 03/13/2013   NA 138 03/13/2013   K 4.3 03/13/2013   CL 103 03/13/2013   CO2 28 03/13/2013   Lab Results  Component Value Date   ALT 21 03/13/2013   AST 22 03/13/2013   ALKPHOS 60 03/13/2013   BILITOT 1.4 (H) 03/13/2013   Lab Results  Component Value Date   ESRSEDRATE 6 01/10/2017   Lab Results  Component Value Date   LABURIC 6.7 01/20/2017    ASSESSMENT AND PLAN:   1) Arthritis, inflammatory, x 3 episodes in last 2-3 months----highly suspect gout, esp with such dramatic improvement with oral steroids.   We discussed the option of daily preventative med for gout (allopurinol), but patient declined this today (I did recommend he start this today).  He would rather just treat any episodes prn at this time, but understands that frequent flares of gout over time can result in joint erosion/deformity and chronic joint pain. He wants to hold off on the preventative med x 6 mo and see how things go. Discussed use of colchicine prn gout flare, gave rx for this today.   He will call or return to office if he has a gout flare that is not improving with use of colchicine for 24h, or if he has new sx's with the arthritis such as fever, malaise, rash, myalgias, neck pain, or HA.  An After Visit Summary was printed and given to the patient.  Return in about 6 months (around 07/29/2017) for routine chronic illness f/u.  Signed:  Santiago BumpersPhil McGowen, MD           01/26/2017

## 2017-07-28 ENCOUNTER — Ambulatory Visit: Payer: BLUE CROSS/BLUE SHIELD | Admitting: Family Medicine

## 2017-11-11 DIAGNOSIS — H60391 Other infective otitis externa, right ear: Secondary | ICD-10-CM | POA: Diagnosis not present

## 2017-11-11 DIAGNOSIS — J0100 Acute maxillary sinusitis, unspecified: Secondary | ICD-10-CM | POA: Diagnosis not present

## 2018-01-10 ENCOUNTER — Ambulatory Visit: Payer: Self-pay | Admitting: *Deleted

## 2018-01-10 ENCOUNTER — Encounter: Payer: Self-pay | Admitting: Family Medicine

## 2018-01-10 ENCOUNTER — Ambulatory Visit: Payer: BLUE CROSS/BLUE SHIELD | Admitting: Family Medicine

## 2018-01-10 VITALS — BP 102/76 | Temp 98.2°F | Ht 70.0 in | Wt 214.2 lb

## 2018-01-10 DIAGNOSIS — M109 Gout, unspecified: Secondary | ICD-10-CM | POA: Diagnosis not present

## 2018-01-10 MED ORDER — METHYLPREDNISOLONE 4 MG PO TBPK: ORAL_TABLET | ORAL | 0 refills | Status: DC

## 2018-01-10 MED ORDER — METHYLPREDNISOLONE ACETATE 80 MG/ML IJ SUSP
120.0000 mg | Freq: Once | INTRAMUSCULAR | Status: AC
  Administered 2018-01-10: 120 mg via INTRAMUSCULAR

## 2018-01-10 NOTE — Progress Notes (Signed)
   Subjective:    Patient ID: Nathaniel Barr, male    DOB: Nov 15, 1980, 37 y.o.   MRN: [id number]  HPI Here for the sudden onset of severe pain, redness, and swelling in the left great toe. No recent trauma. Ibuprofen has helped slightly. This is exactly like an episode had one year ago that responded to prednisone. He was not diagnosed with gout because his uric acid levels were normal.    Review of Systems  Constitutional: Negative.   Respiratory: Negative.   Cardiovascular: Negative.   Musculoskeletal: Positive for arthralgias and joint swelling.       Objective:   Physical Exam  Constitutional:  In pain, limping   Cardiovascular: Normal rate, regular rhythm, normal heart sounds and intact distal pulses.  Pulmonary/Chest: Effort normal and breath sounds normal.  Musculoskeletal:  The MTP joint of the left great toe is swollen, warm, red, and very tender           Assessment & Plan:  This is classic gout. He is given a shot of DepoMedrol and he will follow this with a Medrol dose pack. Recheck prn.  Gershon CraneStephen Fry, MD

## 2018-01-10 NOTE — Telephone Encounter (Signed)
Pt reports great toe of left foot, around to ball of foot, with mild swelling, redness and warm to touch. Painful, 5-6/10, onset Friday. Denies fever, rash, does not recall any injury.  Seen several times in past year for similar symptoms, pt states "Possibly gout but my uric acid levels were never high." States has been taking tylenol and advil, minimally effective. No availability at [facility]; [place]; appt made with Dr. Clent RidgesFry for today. Care advise given per protocol. Reason for Disposition . [1] Redness of the skin AND [2] no fever  Answer Assessment - Initial Assessment Questions 1. ONSET: "When did the pain start?"      Friday 2. LOCATION: "Where is the pain located?"      Left foot, great toe, mostly ball of foot 3. PAIN: "How bad is the pain?"    (Scale 1-10; or mild, moderate, severe)   -  MILD (1-3): doesn't interfere with normal activities    -  MODERATE (4-7): interferes with normal activities (e.g., work or school) or awakens from sleep, limping    -  SEVERE (8-10): excruciating pain, unable to do any normal activities, unable to walk    5-6/10 presently,   9/10 last night 4. WORK OR EXERCISE: "Has there been any recent work or exercise that involved this part of the body?"     No, "I walk a lot for my job." 5. CAUSE: "What do you think is causing the foot pain?"    Unsure, had 1 year ago, possibly gout 6. OTHER SYMPTOMS: "Do you have any other symptoms?" (e.g., leg pain, rash, fever, numbness)    Red, warm to touch, no fever  Protocols used: FOOT PAIN-A-AH

## 2018-01-30 ENCOUNTER — Encounter: Payer: Self-pay | Admitting: Family Medicine

## 2018-03-07 ENCOUNTER — Ambulatory Visit: Payer: BLUE CROSS/BLUE SHIELD | Admitting: Family Medicine

## 2018-03-07 ENCOUNTER — Encounter: Payer: Self-pay | Admitting: Family Medicine

## 2018-03-07 VITALS — BP 122/78 | HR 85 | Temp 98.0°F | Resp 20 | Ht 70.0 in | Wt 214.5 lb

## 2018-03-07 DIAGNOSIS — M109 Gout, unspecified: Secondary | ICD-10-CM

## 2018-03-07 DIAGNOSIS — M79671 Pain in right foot: Secondary | ICD-10-CM

## 2018-03-07 MED ORDER — METHYLPREDNISOLONE 4 MG PO TBPK: ORAL_TABLET | ORAL | 0 refills | Status: DC

## 2018-03-07 MED ORDER — COLCHICINE 0.6 MG PO TABS: ORAL_TABLET | ORAL | 3 refills | Status: AC

## 2018-03-07 MED ORDER — METHYLPREDNISOLONE ACETATE 80 MG/ML IJ SUSP
80.0000 mg | Freq: Once | INTRAMUSCULAR | Status: AC
  Administered 2018-03-07: 80 mg via INTRAMUSCULAR

## 2018-03-07 NOTE — Patient Instructions (Addendum)
Start dose pack tomorrow. Steroid shot provided today. Hydration is at least 100 ounces a day. If sweating, you need 120 ounces or more.  Low purine diet--> look for triggers.  Medication to control gout is daily , called allopurinol. Colchicine called in also, don't start it  this time. This has to be taken at the onset of symptoms.     Low-Purine Diet Purines are compounds that affect the level of uric acid in your body. A low-purine diet is a diet that is low in purines. Eating a low-purine diet can prevent the level of uric acid in your body from getting too high and causing gout or kidney stones or both. What do I need to know about this diet?  Choose low-purine foods. Examples of low-purine foods are listed in the next section.  Drink plenty of fluids, especially water. Fluids can help remove uric acid from your body. Try to drink 8-16 cups (1.9-3.8 L) a day.  Limit foods high in fat, especially saturated fat, as fat makes it harder for the body to get rid of uric acid. Foods high in saturated fat include pizza, cheese, ice cream, whole milk, fried foods, and gravies. Choose foods that are lower in fat and lean sources of protein. Use olive oil when cooking as it contains healthy fats that are not high in saturated fat.  Limit alcohol. Alcohol interferes with the elimination of uric acid from your body. If you are having a gout attack, avoid all alcohol.  Keep in mind that different people's bodies react differently to different foods. You will probably learn over time which foods do or do not affect you. If you discover that a food tends to cause your gout to flare up, avoid eating that food. You can more freely enjoy foods that do not cause problems. If you have any questions about a food item, talk to your dietitian or health care provider. Which foods are low, moderate, and high in purines? The following is a list of foods that are low, moderate, and high in purines. You can eat any  amount of the foods that are low in purines. You may be able to have small amounts of foods that are moderate in purines. Ask your health care provider how much of a food moderate in purines you can have. Avoid foods high in purines. Grains  Foods low in purines: Enriched white bread, pasta, rice, cake, cornbread, popcorn.  Foods moderate in purines: Whole-grain breads and cereals, wheat germ, bran, oatmeal. Uncooked oatmeal. Dry wheat bran or wheat germ.  Foods high in purines: Pancakes, [place] toast, biscuits, muffins. Vegetables  Foods low in purines: All vegetables, except those that are moderate in purines.  Foods moderate in purines: Asparagus, cauliflower, spinach, mushrooms, green peas. Fruits  All fruits are low in purines. Meats and other Protein Foods  Foods low in purines: Eggs, nuts, peanut butter.  Foods moderate in purines: 80-90% lean beef, lamb, veal, pork, poultry, fish, eggs, peanut butter, nuts. Crab, lobster, oysters, and shrimp. Cooked dried beans, peas, and lentils.  Foods high in purines: Anchovies, sardines, herring, mussels, tuna, codfish, scallops, trout, and haddock. Tomasa Blase. Organ meats (such as liver or kidney). Tripe. Game meat. Goose. Sweetbreads. Dairy  All dairy foods are low in purines. Low-fat and fat-free dairy products are best because they are low in saturated fat. Beverages  Drinks low in purines: Water, carbonated beverages, tea, coffee, cocoa.  Drinks moderate in purines: Soft drinks and other drinks sweetened with  high-fructose corn syrup. Juices. To find whether a food or drink is sweetened with high-fructose corn syrup, look at the ingredients list.  Drinks high in purines: Alcoholic beverages (such as beer). Condiments  Foods low in purines: Salt, herbs, olives, pickles, relishes, vinegar.  Foods moderate in purines: Butter, margarine, oils, mayonnaise. Fats and Oils  Foods low in purines: All types, except gravies and sauces made  with meat.  Foods high in purines: Gravies and sauces made with meat. Other Foods  Foods low in purines: Sugars, sweets, gelatin. Cake. Soups made without meat.  Foods moderate in purines: Meat-based or fish-based soups, broths, or bouillons. Foods and drinks sweetened with high-fructose corn syrup.  Foods high in purines: High-fat desserts (such as ice cream, cookies, cakes, pies, doughnuts, and chocolate). Contact your dietitian for more information on foods that are not listed here. This information is not intended to replace advice given to you by your health care provider. Make sure you discuss any questions you have with your health care provider. Document Released: 10/15/2010 Document Revised: 11/26/2015 Document Reviewed: 05/27/2013 Elsevier Interactive Patient Education  2017 [employer].

## 2018-03-07 NOTE — Progress Notes (Signed)
Nathaniel Barr , 1980/12/30, 37 y.o., male MRN: [id number] Patient Care Team    Relationship Specialty Notifications Start End  McGowen, Maryjean Morn, MD PCP - General Family Medicine  01/26/17     Chief Complaint  Patient presents with  . Foot Pain    (right) x 1 week     Subjective: Pt presents for an OV with complaints of right foot pain  of 1 week duration.  Associated symptoms include redness, swelling nad pain in his right large toe and foot. He has had gout flares in the past. Most recent 2 months ago, treated with depo medrol. His flares appear to occur through the summer months only, many times after working out in the heat. He denies alcohol use. He tried a low purine diet in the past.  Pt has tried advil to ease their symptoms.   Depression screen PHQ 2/9 01/21/2016  Decreased Interest 0  Down, Depressed, Hopeless 0  PHQ - 2 Score 0    Allergies  Allergen Reactions  . Shrimp [Shellfish Allergy] Other (See Comments)    unspecified   Social History   Tobacco Use  . Smoking status: Never Smoker  . Smokeless tobacco: Never Used  Substance Use Topics  . Alcohol use: No   Past Medical History:  Diagnosis Date  . 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 Kathi Der [facility] at [facility].  Marland Kitchen Gout    Starting the summer of 2018.  Pt had 3 gout flares within a 2-3 month period.  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   History reviewed. No pertinent surgical history. Family History  Problem Relation Age of Onset  . Heart disease Maternal Grandfather        CABG x 2  . Cancer Other        ? bone cancer ?   Allergies as of 03/07/2018      Reactions   Shrimp [shellfish Allergy] Other (See Comments)   unspecified      Medication List        Accurate as of 03/07/18 11:27 AM. Always use your most recent med list.          multivitamin tablet Take 1 tablet by mouth daily.       All past medical history, surgical history, allergies, family history, immunizations  andmedications were updated in the EMR today and reviewed under the history and medication portions of their EMR.     ROS: Negative, with the exception of above mentioned in HPI   Objective:  BP 122/78 (BP Location: Left Arm, Patient Position: Sitting, Cuff Size: Large)   Pulse 85   Temp 98 F (36.7 C)   Resp 20   Ht 5\' 10"  (1.778 m)   Wt 214 lb 8 oz (97.3 kg)   SpO2 98%   BMI 30.78 kg/m  Body mass index is 30.78 kg/m. Gen: Afebrile. No acute distress. Nontoxic in appearance, well developed, well nourished.  HENT: AT. [place].MMM Eyes:Pupils Equal Round Reactive to light, Extraocular movements intact,  Conjunctiva without redness, discharge or icterus. MSK: mild erythema present right great toe, extending to medial arch. TTP, swelling present. NV intact distally. Skin: no rashes, purpura or petechiae.  Neuro: limping gait. PERLA. EOMi. Alert. Oriented x3   No exam data present No results found. No results found for this or any previous visit (from the past 24 hour(s)).  Assessment/Plan: [name] is a 37 y.o. male present for OV for  Right foot pain/Acute  gout, unspecified cause, unspecified site - low purine diet. Watch for food triggers. Proper hydration discussed. Many of his gout flares are during summer months and after working outside in the heat.  - discussed allopurinol and colchicine with him today. He never tried the colchicine prescribed by PCP last year (never picked up).  - elected to treat acutely with IM depo medrol today and start medrol dose pack tomorrow. NSAIDS for discomfort PRN.  - Colchicine prescribed for future, Discussed proper use and possible expense of med.  - methylPREDNISolone acetate (DEPO-MEDROL) injection 80 mg F/u prn   Reviewed expectations re: course of current medical issues.  Discussed self-management of symptoms.  Outlined signs and symptoms indicating need for more acute intervention.  Patient verbalized understanding and all  questions were answered.  Patient received an After-Visit Summary.    No orders of the defined types were placed in this encounter.    Note is dictated utilizing voice recognition software. Although note has been proof read prior to signing, occasional typographical errors still can be missed. If any questions arise, please do not hesitate to call for verification.   electronically signed by:  Felix Pacini, DO  [facility] - [place]

## 2018-03-20 ENCOUNTER — Ambulatory Visit: Payer: BLUE CROSS/BLUE SHIELD | Admitting: Podiatry

## 2018-03-22 ENCOUNTER — Encounter: Payer: Self-pay | Admitting: Podiatry

## 2018-03-22 ENCOUNTER — Other Ambulatory Visit: Payer: Self-pay | Admitting: Podiatry

## 2018-03-22 ENCOUNTER — Ambulatory Visit (INDEPENDENT_AMBULATORY_CARE_PROVIDER_SITE_OTHER): Payer: BLUE CROSS/BLUE SHIELD

## 2018-03-22 ENCOUNTER — Ambulatory Visit: Payer: BLUE CROSS/BLUE SHIELD | Admitting: Podiatry

## 2018-03-22 VITALS — BP 147/85 | HR 75

## 2018-03-22 DIAGNOSIS — M778 Other enthesopathies, not elsewhere classified: Secondary | ICD-10-CM

## 2018-03-22 DIAGNOSIS — M779 Enthesopathy, unspecified: Secondary | ICD-10-CM

## 2018-03-22 NOTE — Progress Notes (Signed)
Subjective:  Patient ID: Nathaniel Barr, male    DOB: 10/01/1980,  MRN: [id number] HPI Chief Complaint  Patient presents with  . Foot Pain    pt is experiencing left foot pain. Pt often walks in the woods, Pt also experiences tick bites often. Been going on for over a year.     37 y.o. male presents with the above complaint.   ROS: Denies fever chills nausea vomiting muscle aches pains calf pain back pain chest pain shortness of breath.  Past Medical History:  Diagnosis Date  . 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  Marland Kitchen. Gout    Starting the summer of 2018.  Pt had 3 gout flares within a 2-3 month period.  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   No past surgical history on file.  Current Outpatient Medications:  .  ibuprofen (ADVIL,MOTRIN) 200 MG tablet, Take 200 mg by mouth as needed., Disp: , Rfl:  .  colchicine 0.6 MG tablet, 2 tabs po at onset of gouty joint pain, then 1 tab po 2 hours later, then starting the following day take 1 tab po bid until all joint pain is gone, Disp: 20 tablet, Rfl: 3 .  methylPREDNISolone (MEDROL DOSEPAK) 4 MG TBPK tablet, As directed, Disp: 21 tablet, Rfl: 0 .  Multiple Vitamin (MULTIVITAMIN) tablet, Take 1 tablet by mouth daily., Disp: , Rfl:   Allergies  Allergen Reactions  . Shrimp [Shellfish Allergy] Other (See Comments)    unspecified   Review of Systems Objective:   Vitals:   03/22/18 1021  BP: (!) 147/85  Pulse: 75    General: Well developed, nourished, in no acute distress, alert and oriented x3   Dermatological: Skin is warm, dry and supple bilateral. Nails x 10 are well maintained; remaining integument appears unremarkable at this time. There are no open sores, no preulcerative lesions, no rash or signs of infection present.  Vascular: Dorsalis Pedis artery and Posterior Tibial artery pedal pulses are 2/4 bilateral with immedate capillary fill time. Pedal hair growth present. No varicosities and no lower  extremity edema present bilateral.   Neruologic: Grossly intact via light touch bilateral. Vibratory intact via tuning fork bilateral. Protective threshold with Semmes Wienstein monofilament intact to all pedal sites bilateral. Patellar and Achilles deep tendon reflexes 2+ bilateral. No Babinski or clonus noted bilateral.   Musculoskeletal: No gross boney pedal deformities bilateral. No pain, crepitus, or limitation noted with foot and ankle range of motion bilateral. Muscular strength 5/5 in all groups tested bilateral.  Continued pain on palpation and range of motion of the first metatarsophalangeal joint of the right foot there is no erythema edema cellulitis drainage or odor.  Gait: Unassisted, Nonantalgic.    Radiographs:  Radiographs taken today do not demonstrate any major osseous abnormalities though does demonstrate soft tissue increase in density of the collateral ligaments of the first metatarsophalangeal joint right.  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    Plan: At this point I explained to him that is really no way for me to tell what is going on with him because his blood work was drawn so late after the onset of his inflammation but I feel that we would need a additional flareup before we could draw blood and be accurate with our treatment plan.  Consider orthotics in the future.  Should this patient call requesting blood work for gout.  I would like  for him to be provided with a requisition for blood work requesting panels such as a CBC and a arthritic panel.     Jareli Highland T. [place], [place]

## 2018-04-18 ENCOUNTER — Other Ambulatory Visit: Payer: Self-pay

## 2018-04-18 ENCOUNTER — Ambulatory Visit: Payer: BLUE CROSS/BLUE SHIELD | Admitting: Family Medicine

## 2018-04-18 ENCOUNTER — Encounter: Payer: Self-pay | Admitting: Family Medicine

## 2018-04-18 VITALS — BP 128/80 | HR 80 | Temp 98.0°F | Resp 16 | Ht 70.0 in | Wt 211.2 lb

## 2018-04-18 DIAGNOSIS — M109 Gout, unspecified: Secondary | ICD-10-CM | POA: Diagnosis not present

## 2018-04-18 LAB — CBC WITH DIFFERENTIAL/PLATELET
BASOS ABS: 0 10*3/uL (ref 0.0–0.1)
Basophils Relative: 0.6 % (ref 0.0–3.0)
EOS PCT: 1.2 % (ref 0.0–5.0)
Eosinophils Absolute: 0.1 10*3/uL (ref 0.0–0.7)
HCT: 39.1 % (ref 39.0–52.0)
HEMOGLOBIN: 13.5 g/dL (ref 13.0–17.0)
Lymphocytes Relative: 24.6 % (ref 12.0–46.0)
Lymphs Abs: 1.8 10*3/uL (ref 0.7–4.0)
MCHC: 34.6 g/dL (ref 30.0–36.0)
MCV: 84.7 fl (ref 78.0–100.0)
MONOS PCT: 8.7 % (ref 3.0–12.0)
Monocytes Absolute: 0.6 10*3/uL (ref 0.1–1.0)
Neutro Abs: 4.6 10*3/uL (ref 1.4–7.7)
Neutrophils Relative %: 64.9 % (ref 43.0–77.0)
Platelets: 265 10*3/uL (ref 150.0–400.0)
RBC: 4.62 Mil/uL (ref 4.22–5.81)
RDW: 13.1 % (ref 11.5–15.5)
WBC: 7.2 10*3/uL (ref 4.0–10.5)

## 2018-04-18 LAB — URIC ACID: URIC ACID, SERUM: 8.1 mg/dL — AB (ref 4.0–7.8)

## 2018-04-18 LAB — BASIC METABOLIC PANEL
BUN: 14 mg/dL (ref 6–23)
CALCIUM: 9.6 mg/dL (ref 8.4–10.5)
CO2: 32 mEq/L (ref 19–32)
Chloride: 102 mEq/L (ref 96–112)
Creatinine, Ser: 0.89 mg/dL (ref 0.40–1.50)
GFR: 102.13 mL/min (ref >60.00)
GLUCOSE: 116 mg/dL — AB (ref 70–99)
Potassium: 3.5 mEq/L (ref 3.5–5.1)
Sodium: 140 mEq/L (ref 135–145)

## 2018-04-18 LAB — HEPATIC FUNCTION PANEL
ALBUMIN: 4.8 g/dL (ref 3.5–5.2)
ALT: 20 U/L (ref 0–53)
AST: 19 U/L (ref 0–37)
Alkaline Phosphatase: 68 U/L (ref 39–117)
Bilirubin, Direct: 0.2 mg/dL (ref 0.0–0.3)
Total Bilirubin: 1.2 mg/dL (ref 0.2–1.2)
Total Protein: 7.1 g/dL (ref 6.0–8.3)

## 2018-04-18 MED ORDER — ALLOPURINOL 100 MG PO TABS: 100.0000 mg | ORAL_TABLET | Freq: Every day | ORAL | 6 refills | Status: AC

## 2018-04-18 MED ORDER — PREDNISONE 10 MG PO TABS: ORAL_TABLET | ORAL | 0 refills | Status: DC

## 2018-04-18 NOTE — Progress Notes (Signed)
   Subjective:    Patient ID: Nathaniel Barr, male    DOB: [birth year], 37 y.o.   MRN: [id number]  HPI Gout- pt reports he has had foot pain, 'almost daily' since July.  R foot.  Has been seen 'several times'- last year was tx'd w/ Doxy for tick bite.  sxs resolved but returned again this year around the same time.  Pt reports that his whole foot will swell.  Last night entire foot was swollen and red.  Took 3 Advil last night and went to bed.  Pt is on his feet all day.   Review of Systems For ROS see HPI     Objective:   Physical Exam  Constitutional: He is oriented to person, place, and time. He appears well-developed and well-nourished. No distress.  HENT:  Head: Normocephalic and atraumatic.  Cardiovascular: Intact distal pulses.  Musculoskeletal: He exhibits edema (swelling of R 1st MTP joint w/ exquisite TTP) and tenderness.  Neurological: He is alert and oriented to person, place, and time.  Skin: Skin is warm and dry. There is erythema.  Vitals reviewed.         Assessment & Plan:  Gout- new to provider, ongoing for pt.  He is not convinced that he has gout b/c 'no one has ever proven it to me'.  Discussed that proof would require a joint aspiration followed by examination under a special microscope to show birefringence.  Reviewed previous notes and labs.  Suspect this is gout and discussed that uric acid may be normal or low during an acute flare.  Start Prednisone taper for current flare and start Allopurinol to prevent additional flares.  Pt again counseled on low purine diet and need for increased water intake.  Reviewed supportive care and red flags that should prompt return.  Pt expressed understanding and is in agreement w/ plan.

## 2018-04-18 NOTE — Patient Instructions (Addendum)
Follow up as needed or as scheduled START the Prednisone as directed- take w/ food START the Allopurinol once daily Drink plenty of water- try and avoid the packets temporarily Eat a low purine diet when possible Call with any questions or concerns Hang in there!!!

## 2018-04-19 ENCOUNTER — Ambulatory Visit: Payer: BLUE CROSS/BLUE SHIELD | Admitting: Podiatry

## 2018-04-25 IMAGING — DX DG TOE GREAT 2+V*L*
3 series · 3 of 3 positions shown · non-contrast
Comparison: None.

CLINICAL DATA: Pain and swelling with redness

EXAM:
LEFT FIRST TOE:  3 V

[toe ap]
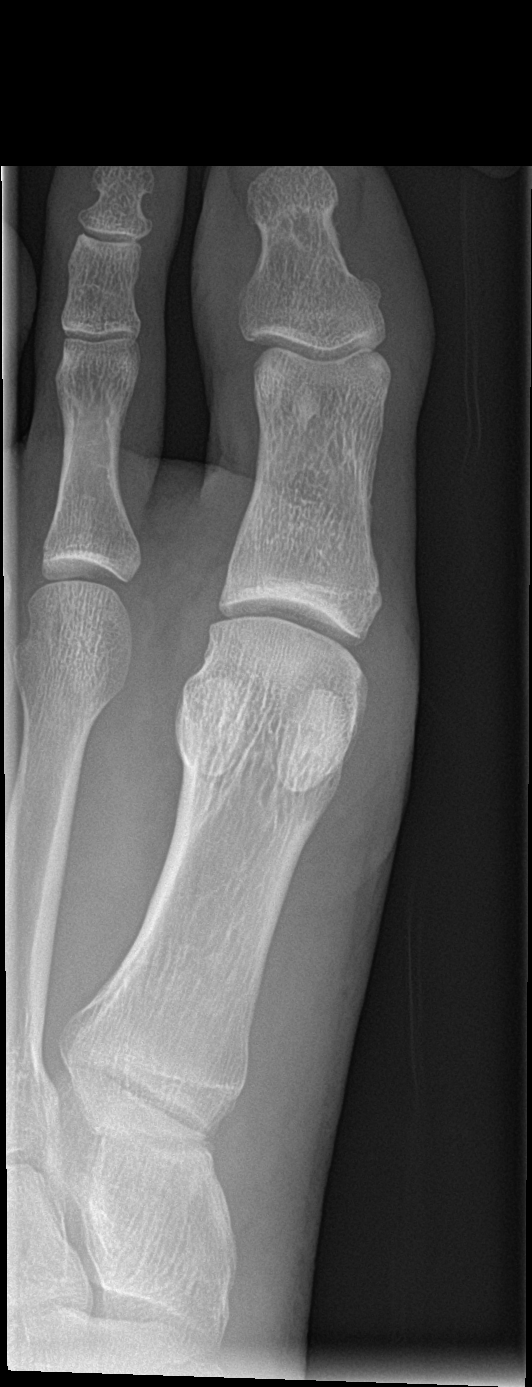

[toe obl]
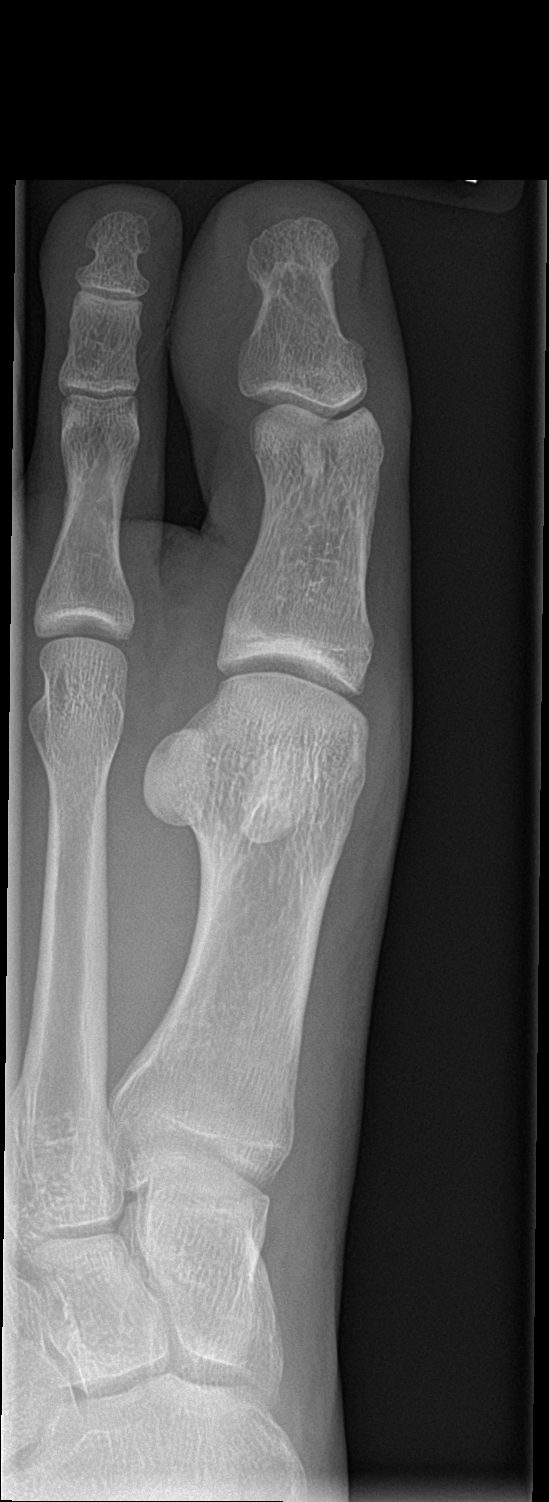

[toe lat]
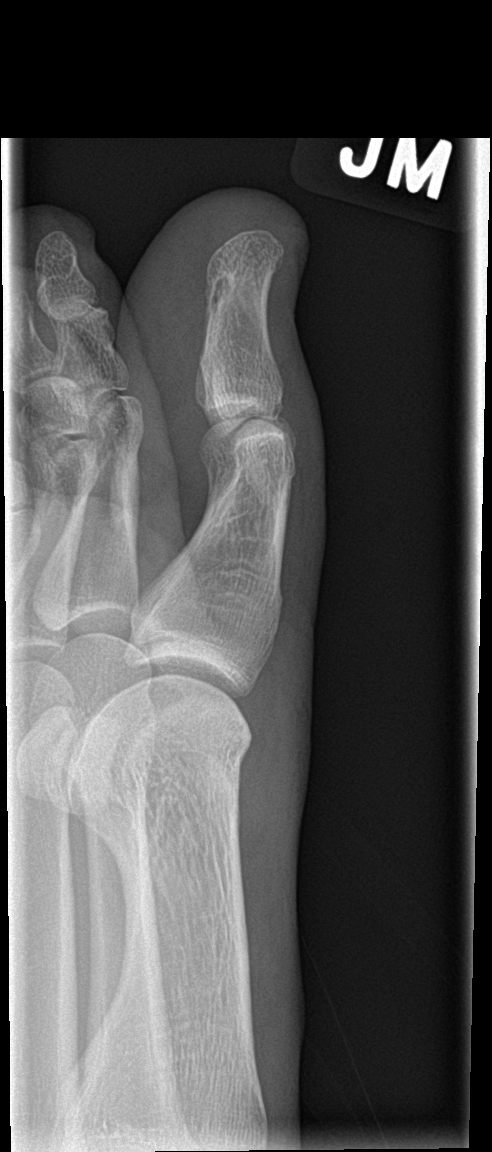

[3 of 3 positions shown; findings below may reference images not displayed]

FINDINGS: Frontal, oblique, and lateral views were obtained. No fracture or
dislocation. Joint spaces appear normal. No erosive change. There is
a small bone island in the distal aspect of the second middle
phalanx.
IMPRESSION: No fracture or dislocation.  No evident arthropathy.

## 2018-05-15 ENCOUNTER — Encounter: Payer: Self-pay | Admitting: Family Medicine

## 2018-07-25 DIAGNOSIS — M9901 Segmental and somatic dysfunction of cervical region: Secondary | ICD-10-CM | POA: Diagnosis not present

## 2018-07-25 DIAGNOSIS — M9902 Segmental and somatic dysfunction of thoracic region: Secondary | ICD-10-CM | POA: Diagnosis not present

## 2018-07-25 DIAGNOSIS — M62830 Muscle spasm of back: Secondary | ICD-10-CM | POA: Diagnosis not present

## 2018-07-25 DIAGNOSIS — M546 Pain in thoracic spine: Secondary | ICD-10-CM | POA: Diagnosis not present

## 2018-07-26 DIAGNOSIS — M546 Pain in thoracic spine: Secondary | ICD-10-CM | POA: Diagnosis not present

## 2018-07-26 DIAGNOSIS — M9902 Segmental and somatic dysfunction of thoracic region: Secondary | ICD-10-CM | POA: Diagnosis not present

## 2018-07-26 DIAGNOSIS — M62830 Muscle spasm of back: Secondary | ICD-10-CM | POA: Diagnosis not present

## 2018-07-26 DIAGNOSIS — M9901 Segmental and somatic dysfunction of cervical region: Secondary | ICD-10-CM | POA: Diagnosis not present

## 2018-07-30 DIAGNOSIS — M9901 Segmental and somatic dysfunction of cervical region: Secondary | ICD-10-CM | POA: Diagnosis not present

## 2018-07-30 DIAGNOSIS — M9902 Segmental and somatic dysfunction of thoracic region: Secondary | ICD-10-CM | POA: Diagnosis not present

## 2018-07-30 DIAGNOSIS — M546 Pain in thoracic spine: Secondary | ICD-10-CM | POA: Diagnosis not present

## 2018-07-30 DIAGNOSIS — M62830 Muscle spasm of back: Secondary | ICD-10-CM | POA: Diagnosis not present

## 2018-08-02 DIAGNOSIS — M9902 Segmental and somatic dysfunction of thoracic region: Secondary | ICD-10-CM | POA: Diagnosis not present

## 2018-08-02 DIAGNOSIS — M9901 Segmental and somatic dysfunction of cervical region: Secondary | ICD-10-CM | POA: Diagnosis not present

## 2018-08-02 DIAGNOSIS — M546 Pain in thoracic spine: Secondary | ICD-10-CM | POA: Diagnosis not present

## 2018-08-02 DIAGNOSIS — M62830 Muscle spasm of back: Secondary | ICD-10-CM | POA: Diagnosis not present

## 2018-08-06 DIAGNOSIS — M9902 Segmental and somatic dysfunction of thoracic region: Secondary | ICD-10-CM | POA: Diagnosis not present

## 2018-08-06 DIAGNOSIS — M9901 Segmental and somatic dysfunction of cervical region: Secondary | ICD-10-CM | POA: Diagnosis not present

## 2018-08-06 DIAGNOSIS — M546 Pain in thoracic spine: Secondary | ICD-10-CM | POA: Diagnosis not present

## 2018-08-06 DIAGNOSIS — M62830 Muscle spasm of back: Secondary | ICD-10-CM | POA: Diagnosis not present

## 2018-08-09 DIAGNOSIS — M9901 Segmental and somatic dysfunction of cervical region: Secondary | ICD-10-CM | POA: Diagnosis not present

## 2018-08-09 DIAGNOSIS — M62830 Muscle spasm of back: Secondary | ICD-10-CM | POA: Diagnosis not present

## 2018-08-09 DIAGNOSIS — M546 Pain in thoracic spine: Secondary | ICD-10-CM | POA: Diagnosis not present

## 2018-08-09 DIAGNOSIS — M9902 Segmental and somatic dysfunction of thoracic region: Secondary | ICD-10-CM | POA: Diagnosis not present

## 2018-08-13 DIAGNOSIS — M9901 Segmental and somatic dysfunction of cervical region: Secondary | ICD-10-CM | POA: Diagnosis not present

## 2018-08-13 DIAGNOSIS — M546 Pain in thoracic spine: Secondary | ICD-10-CM | POA: Diagnosis not present

## 2018-08-13 DIAGNOSIS — M62830 Muscle spasm of back: Secondary | ICD-10-CM | POA: Diagnosis not present

## 2018-08-13 DIAGNOSIS — M9902 Segmental and somatic dysfunction of thoracic region: Secondary | ICD-10-CM | POA: Diagnosis not present

## 2018-08-16 DIAGNOSIS — M546 Pain in thoracic spine: Secondary | ICD-10-CM | POA: Diagnosis not present

## 2018-08-16 DIAGNOSIS — M9902 Segmental and somatic dysfunction of thoracic region: Secondary | ICD-10-CM | POA: Diagnosis not present

## 2018-08-16 DIAGNOSIS — M62830 Muscle spasm of back: Secondary | ICD-10-CM | POA: Diagnosis not present

## 2018-08-16 DIAGNOSIS — M9901 Segmental and somatic dysfunction of cervical region: Secondary | ICD-10-CM | POA: Diagnosis not present

## 2018-08-20 DIAGNOSIS — M62830 Muscle spasm of back: Secondary | ICD-10-CM | POA: Diagnosis not present

## 2018-08-20 DIAGNOSIS — M546 Pain in thoracic spine: Secondary | ICD-10-CM | POA: Diagnosis not present

## 2018-08-20 DIAGNOSIS — M9902 Segmental and somatic dysfunction of thoracic region: Secondary | ICD-10-CM | POA: Diagnosis not present

## 2018-08-20 DIAGNOSIS — M9901 Segmental and somatic dysfunction of cervical region: Secondary | ICD-10-CM | POA: Diagnosis not present

## 2020-07-04 DIAGNOSIS — Z8616 Personal history of COVID-19: Secondary | ICD-10-CM

## 2020-07-04 HISTORY — DX: Personal history of COVID-19: Z86.16

## 2020-11-07 ENCOUNTER — Encounter (HOSPITAL_BASED_OUTPATIENT_CLINIC_OR_DEPARTMENT_OTHER): Payer: Self-pay

## 2020-11-07 ENCOUNTER — Other Ambulatory Visit: Payer: Self-pay

## 2020-11-07 ENCOUNTER — Emergency Department (HOSPITAL_BASED_OUTPATIENT_CLINIC_OR_DEPARTMENT_OTHER): Payer: BC Managed Care – PPO | Admitting: Radiology

## 2020-11-07 ENCOUNTER — Emergency Department (HOSPITAL_BASED_OUTPATIENT_CLINIC_OR_DEPARTMENT_OTHER)
Admission: EM | Admit: 2020-11-07 | Discharge: 2020-11-07 | Disposition: A | Discharge status: Home / Self Care | Payer: BC Managed Care – PPO | Attending: Emergency Medicine | Admitting: Emergency Medicine

## 2020-11-07 DIAGNOSIS — S8991XA Unspecified injury of right lower leg, initial encounter: Secondary | ICD-10-CM | POA: Insufficient documentation

## 2020-11-07 DIAGNOSIS — M25461 Effusion, right knee: Secondary | ICD-10-CM | POA: Diagnosis not present

## 2020-11-07 DIAGNOSIS — M25561 Pain in right knee: Secondary | ICD-10-CM

## 2020-11-07 DIAGNOSIS — X501XXA Overexertion from prolonged static or awkward postures, initial encounter: Secondary | ICD-10-CM | POA: Insufficient documentation

## 2020-11-07 NOTE — ED Provider Notes (Signed)
[facility] EMERGENCY DEPT Provider Note   CSN: [id number] Arrival date & time: 11/07/20  1323     History Chief Complaint  Patient presents with  . Knee Injury    Nathaniel Barr is a 40 y.o. male.  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  The history is provided by the patient.  Knee Pain Location:  Knee Injury: yes   Mechanism of injury comment:  Sitting with leg under him, and when he went to get up he heard a pop Knee location:  R knee Pain details:    Quality:  Throbbing   Radiates to:  Does not radiate   Severity:  Severe   Onset quality:  Sudden   Timing:  Constant   Progression:  Unchanged Chronicity:  New Dislocation: no   Prior injury to area:  No Relieved by:  Rest Worsened by:  Bearing weight Associated symptoms: decreased ROM and swelling   Associated symptoms: no back pain and no fever        Past Medical History:  Diagnosis Date  . 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 Kathi Der notes at [facility].  Marland Kitchen Gout    Starting the summer of 2018.  Pt had 3 gout flares within a 2-3 month period.  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  Pt was started on allopurinol by Dr. Beverely Low 04/2018.    Patient Active Problem List   Diagnosis Date Noted  . Gout 01/10/2018  . Left foot pain 01/10/2017  . Other malaise and fatigue 03/13/2013    History reviewed. No pertinent surgical history.     Family History  Problem Relation Age of Onset  . Heart disease Maternal Grandfather        CABG x 2  . Cancer Other        ? bone cancer ?    Social History   Tobacco Use  . Smoking status: Never Smoker  . Smokeless tobacco: Never Used  Vaping Use  . Vaping Use: Never used  Substance Use Topics  . Alcohol use: No  . Drug use: No    Home Medications Prior to Admission medications   Medication Sig Start Date End Date Taking? Authorizing Provider  allopurinol (ZYLOPRIM) 100 MG tablet Take 1 tablet (100 mg total) by mouth  daily. 04/18/18   Sheliah Hatch, MD  colchicine 0.6 MG tablet 2 tabs po at onset of gouty joint pain, then 1 tab po 2 hours later, then starting the following day take 1 tab po bid until all joint pain is gone Patient not taking: Reported on 04/18/2018 03/07/18   Kuneff, Renee A, DO  ibuprofen (ADVIL,MOTRIN) 200 MG tablet Take 200 mg by mouth as needed.    [provider]  Multiple Vitamin (MULTIVITAMIN) tablet Take 1 tablet by mouth daily.    [provider]  predniSONE (DELTASONE) 10 MG tablet 3 tabs x3 days and then 2 tabs x3 days and then 1 tab x3 days.  Take w/ food. 04/18/18   Sheliah Hatch, MD    Allergies    Shrimp [shellfish allergy]  Review of Systems   Review of Systems  Constitutional: Negative for chills and fever.  HENT: Negative for ear pain and sore throat.   Eyes: Negative for pain and visual disturbance.  Respiratory: Negative for cough and shortness of breath.   Cardiovascular: Negative for chest pain and palpitations.  Gastrointestinal: Negative for abdominal pain and vomiting.  Genitourinary: Negative  for dysuria and hematuria.  Musculoskeletal: Negative for arthralgias and back pain.  Skin: Negative for color change and rash.  Neurological: Negative for seizures and syncope.  All other systems reviewed and are negative.   Physical Exam Updated Vital Signs BP 140/87 (BP Location: Right Arm)   Pulse 70   Temp 99 F (37.2 C) (Oral)   Resp 14   Ht 6' (1.829 m)   Wt 97.5 kg   SpO2 100%   BMI 29.16 kg/m   Physical Exam Vitals and nursing note reviewed.  Constitutional:      Appearance: Normal appearance.  HENT:     Head: Normocephalic and atraumatic.  Eyes:     Conjunctiva/sclera: Conjunctivae normal.  Pulmonary:     Effort: Pulmonary effort is normal. No respiratory distress.  Musculoskeletal:        General: No deformity. Normal range of motion.     Cervical back: Normal range of motion.     Comments: The right knee is  normal to inspection without deformity.  There is a 1+ knee effusion.  Range of motion is from 20degrees to 100 degrees.  Hip range of motion is normal.  There is tenderness to palpation over the medial joint line.  The patient is unable to participate with full provocative testing, but he does not appear to have laxity upon varus or valgus stress test.  Lachman test is normal.  McMurray is positive for pain.  Distal pulses are normal.  Sensation is normal.  Skin:    General: Skin is warm and dry.  Neurological:     General: No focal deficit present.     Mental Status: He is alert and oriented to person, place, and time. Mental status is at baseline.  Psychiatric:        Mood and Affect: Mood normal.     ED Results / Procedures / Treatments   Labs (all labs ordered are listed, but only abnormal results are displayed) Labs Reviewed - No data to display  EKG None  Radiology DG Knee Complete 4 Views Right  Result Date: 11/07/2020 CLINICAL DATA:  Twisting injury earlier today with a popping noise. Now with right knee pain and inability to bear weight. Can not straighten knee. EXAM: RIGHT KNEE - COMPLETE 4+ VIEW COMPARISON:  None. FINDINGS: No evidence of fracture, dislocation, or joint effusion. No evidence of arthropathy or other focal bone abnormality. Soft tissues are unremarkable. IMPRESSION: Negative. Electronically Signed   By: Amie Portland M.D.   On: 11/07/2020 13:57    Procedures Procedures   Medications Ordered in ED Medications - No data to display  ED Course  I have reviewed the triage vital signs and the nursing notes.  Pertinent labs & imaging results that were available during my care of the patient were reviewed by me and considered in my medical decision making (see chart for details).    MDM Rules/Calculators/A&P                          I think it is most likely that [name] has sustained a meniscal tear of his right medial meniscus.  Alternative diagnoses  are ACL tear, MCL tear, patellar subluxation/dislocation.  He was advised about symptomatic management, and I recommended that he see orthopedics in follow-up.  He understands that MRI may be necessary if his symptoms do not respond to conservative management. Final Clinical Impression(s) / ED Diagnoses Final diagnoses:  Acute pain of  right knee    Rx / DC Orders ED Discharge Orders    None       Koleen Distance, MD 11/07/20 1416

## 2020-11-07 NOTE — ED Triage Notes (Signed)
Heard a pop in his knee earlier today while moving leg while sitting on the ground

## 2020-11-21 ENCOUNTER — Telehealth: Payer: BC Managed Care – PPO | Admitting: Family

## 2020-11-21 DIAGNOSIS — M7989 Other specified soft tissue disorders: Secondary | ICD-10-CM

## 2020-11-22 NOTE — Progress Notes (Signed)
Waiting on response from patient.    Jannifer Rodney, FNP  Approximately 5 minutes was spent documenting and reviewing patient's chart.

## 2020-11-24 ENCOUNTER — Encounter: Payer: Self-pay | Admitting: Nurse Practitioner

## 2020-12-07 ENCOUNTER — Other Ambulatory Visit: Payer: Self-pay

## 2020-12-07 ENCOUNTER — Encounter (HOSPITAL_BASED_OUTPATIENT_CLINIC_OR_DEPARTMENT_OTHER): Payer: Self-pay | Admitting: Orthopedic Surgery

## 2020-12-07 NOTE — Anesthesia Preprocedure Evaluation (Addendum)
Anesthesia Evaluation  Patient identified by MRN, date of birth, ID band Patient awake    Reviewed: Allergy & Precautions, NPO status , Patient's Chart, lab work & pertinent test results  Airway Mallampati: II  TM Distance: >3 FB Neck ROM: Full    Dental no notable dental hx. (+) Teeth Intact, Dental Advisory Given   Pulmonary neg pulmonary ROS,    Pulmonary exam normal breath sounds clear to auscultation       Cardiovascular negative cardio ROS Normal cardiovascular exam Rhythm:Regular Rate:Normal     Neuro/Psych negative neurological ROS  negative psych ROS   GI/Hepatic negative GI ROS, Neg liver ROS,   Endo/Other  Gout  Renal/GU negative Renal ROS  negative genitourinary   Musculoskeletal Torn Medial Meniscus right knee   Abdominal   Peds  Hematology Hx/o Lyme's disease 12/2011   Anesthesia Other Findings   Reproductive/Obstetrics                            Anesthesia Physical Anesthesia Plan  ASA: II  Anesthesia Plan: General   Post-op Pain Management:    Induction: Intravenous  PONV Risk Score and Plan: 3 and Treatment may vary due to age or medical condition, Midazolam, Dexamethasone and Ondansetron  Airway Management Planned: LMA  Additional Equipment:   Intra-op Plan:   Post-operative Plan: Extubation in OR  Informed Consent: I have reviewed the patients History and Physical, chart, labs and discussed the procedure including the risks, benefits and alternatives for the proposed anesthesia with the patient or authorized representative who has indicated his/her understanding and acceptance.     Dental advisory given  Plan Discussed with: CRNA and Anesthesiologist  Anesthesia Plan Comments:        Anesthesia Quick Evaluation

## 2020-12-08 ENCOUNTER — Ambulatory Visit: Payer: Self-pay | Admitting: Physician Assistant

## 2020-12-08 NOTE — H&P (Signed)
Nathaniel Barr is an 39 y.o. male.   Chief Complaint: R knee pain HPI: To review: This patient has a complicated course as we are awaiting surgery. Cancelled him last week with presumed diagnosis of cellulitis. In hindsight, piecing this together and discussing this with a great deal of detail, it seems like the patient had an attack of gout  or pseudogout. He has episodes basically yearly where he has a spontaneous swelling of the foot and ankle redness. He was out of town in [place] where he went on a trip. He has a bucket handle tear in the opposite right knee. This is all in the left leg. No fevers or chills. Did not respond to antibiotics. Negative venous Doppler Ultrasound., Pretty much he has been told in the past his Uric Acid was normal but despite  this I agree, he was placed on  Colcrys which is making him nauseous. He has had this happen before.   Past Medical History:  Diagnosis Date  . 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 Moore's notes at [facility].  . Gout    Starting the summer of 2018.  Pt had 3 gout flares within a 2-3 month period.  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  Pt was started on allopurinol by Dr. Tabori 04/2018.    No past surgical history on file.  Family History  Problem Relation Age of Onset  . Heart disease Maternal Grandfather        CABG x 2  . Cancer Other        ? bone cancer ?   Social History:  reports that he has never smoked. He has never used smokeless tobacco. He reports that he does not drink alcohol and does not use drugs.  Allergies:  Allergies  Allergen Reactions  . Shrimp [Shellfish Allergy] Other (See Comments)    unspecified    (Not in a hospital admission)   No results found for this or any previous visit (from the past 48 hour(s)). No results found.  Review of Systems  Musculoskeletal: Positive for arthralgias and joint swelling.  All other systems reviewed and are negative.   There were no vitals taken for this  visit. Physical Exam Constitutional:      General: He is not in acute distress.    Appearance: Normal appearance.  HENT:     Head: Normocephalic and atraumatic.  Eyes:     Extraocular Movements: Extraocular movements intact.     Pupils: Pupils are equal, round, and reactive to light.  Cardiovascular:     Rate and Rhythm: Normal rate.     Pulses: Normal pulses.  Pulmonary:     Effort: Pulmonary effort is normal. No respiratory distress.  Abdominal:     General: Abdomen is flat. There is no distension.  Musculoskeletal:     Cervical back: Normal range of motion and neck supple.     Right knee: Swelling and effusion present. Tenderness present over the medial joint line.  Skin:    General: Skin is warm and dry.     Findings: No erythema or rash.  Neurological:     General: No focal deficit present.     Mental Status: He is alert and oriented to person, place, and time.  Psychiatric:        Mood and Affect: Mood normal.        Behavior: Behavior normal.      Assessment/Plan Again, I told him it is a little bit hard   to get a specific diagnosis and there is nothing to tap.  When the Uric Acid is normal it makes it harder to diagnose.   Notwithstanding that, he had a bucket handle in the opposite knee. He states the left leg, which was severely inflamed, feels about 75% better. There is no evidence of any change whatsoever in the right leg and he has a bucket  handle, which is not causing but so many symptoms acutely, but it has been shown too  that I have seen chronic bucket handles cause a fair amount of femoral condyle damage. Therefore, I think based on the fact he is better with the left lower extremities, we could proceed with a right leg meniscectomy.  Notwithstanding that, I would cover him with Celebrex b.i.d. after surgery, give him a good dose of steroid. I do not feel compelled to do prolonged antibiotics. I will be giving a dose of IV antibiotics. Aspirin after surgery. He  will have a physical therapy follow-up right after surgery. Again, eventually I think once we get him past the  initial week or two of recovery we  will  probably need to consider a rheumatology referral in this patient. Again, I would probably use traditional nonsteroidal's to use to cover him. If we need to we can go to a dosepak. We will defer the dosepak presently based on going forward with surgery.  Margart Sickles, PA-C 12/08/2020, 9:41 AM

## 2020-12-08 NOTE — H&P (View-Only) (Signed)
Nathaniel Barr is an 40 y.o. male.   Chief Complaint: R knee pain HPI: To review: This patient has a complicated course as we are awaiting surgery. Cancelled him last week with presumed diagnosis of cellulitis. In hindsight, piecing this together and discussing this with a great deal of detail, it seems like the patient had an attack of gout  or pseudogout. He has episodes basically yearly where he has a spontaneous swelling of the foot and ankle redness. He was out of town in [place] where he went on a trip. He has a bucket handle tear in the opposite right knee. This is all in the left leg. No fevers or chills. Did not respond to antibiotics. Negative venous Doppler Ultrasound., Pretty much he has been told in the past his Uric Acid was normal but despite  this I agree, he was placed on  Colcrys which is making him nauseous. He has had this happen before.   Past Medical History:  Diagnosis Date  . 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 Kathi Der notes at [facility].  Marland Kitchen Gout    Starting the summer of 2018.  Pt had 3 gout flares within a 2-3 month period.  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  Pt was started on allopurinol by Dr. Beverely Low 04/2018.    No past surgical history on file.  Family History  Problem Relation Age of Onset  . Heart disease Maternal Grandfather        CABG x 2  . Cancer Other        ? bone cancer ?   Social History:  reports that he has never smoked. He has never used smokeless tobacco. He reports that he does not drink alcohol and does not use drugs.  Allergies:  Allergies  Allergen Reactions  . Shrimp [Shellfish Allergy] Other (See Comments)    unspecified    (Not in a hospital admission)   No results found for this or any previous visit (from the past 48 hour(s)). No results found.  Review of Systems  Musculoskeletal: Positive for arthralgias and joint swelling.  All other systems reviewed and are negative.   There were no vitals taken for this  visit. Physical Exam Constitutional:      General: He is not in acute distress.    Appearance: Normal appearance.  HENT:     Head: Normocephalic and atraumatic.  Eyes:     Extraocular Movements: Extraocular movements intact.     Pupils: Pupils are equal, round, and reactive to light.  Cardiovascular:     Rate and Rhythm: Normal rate.     Pulses: Normal pulses.  Pulmonary:     Effort: Pulmonary effort is normal. No respiratory distress.  Abdominal:     General: Abdomen is flat. There is no distension.  Musculoskeletal:     Cervical back: Normal range of motion and neck supple.     Right knee: Swelling and effusion present. Tenderness present over the medial joint line.  Skin:    General: Skin is warm and dry.     Findings: No erythema or rash.  Neurological:     General: No focal deficit present.     Mental Status: He is alert and oriented to person, place, and time.  Psychiatric:        Mood and Affect: Mood normal.        Behavior: Behavior normal.      Assessment/Plan Again, I told him it is a little bit hard  to get a specific diagnosis and there is nothing to tap.  When the Uric Acid is normal it makes it harder to diagnose.   Notwithstanding that, he had a bucket handle in the opposite knee. He states the left leg, which was severely inflamed, feels about 75% better. There is no evidence of any change whatsoever in the right leg and he has a bucket  handle, which is not causing but so many symptoms acutely, but it has been shown too  that I have seen chronic bucket handles cause a fair amount of femoral condyle damage. Therefore, I think based on the fact he is better with the left lower extremities, we could proceed with a right leg meniscectomy.  Notwithstanding that, I would cover him with Celebrex b.i.d. after surgery, give him a good dose of steroid. I do not feel compelled to do prolonged antibiotics. I will be giving a dose of IV antibiotics. Aspirin after surgery. He  will have a physical therapy follow-up right after surgery. Again, eventually I think once we get him past the  initial week or two of recovery we  will  probably need to consider a rheumatology referral in this patient. Again, I would probably use traditional nonsteroidal's to use to cover him. If we need to we can go to a dosepak. We will defer the dosepak presently based on going forward with surgery.  Margart Sickles, PA-C 12/08/2020, 9:41 AM

## 2020-12-09 ENCOUNTER — Encounter (HOSPITAL_BASED_OUTPATIENT_CLINIC_OR_DEPARTMENT_OTHER)
Admission: RE | Disposition: A | Discharge status: Home / Self Care | Payer: Self-pay | Source: Home / Self Care | Attending: Orthopedic Surgery

## 2020-12-09 ENCOUNTER — Encounter (HOSPITAL_BASED_OUTPATIENT_CLINIC_OR_DEPARTMENT_OTHER): Payer: Self-pay | Admitting: Orthopedic Surgery

## 2020-12-09 ENCOUNTER — Other Ambulatory Visit: Payer: Self-pay

## 2020-12-09 ENCOUNTER — Ambulatory Visit (HOSPITAL_BASED_OUTPATIENT_CLINIC_OR_DEPARTMENT_OTHER): Payer: BC Managed Care – PPO | Admitting: Anesthesiology

## 2020-12-09 ENCOUNTER — Ambulatory Visit (HOSPITAL_BASED_OUTPATIENT_CLINIC_OR_DEPARTMENT_OTHER)
Admission: RE | Admit: 2020-12-09 | Discharge: 2020-12-09 | Disposition: A | Discharge status: Home / Self Care | Payer: BC Managed Care – PPO | Attending: Orthopedic Surgery | Admitting: Orthopedic Surgery

## 2020-12-09 DIAGNOSIS — M109 Gout, unspecified: Secondary | ICD-10-CM | POA: Diagnosis not present

## 2020-12-09 DIAGNOSIS — S83211A Bucket-handle tear of medial meniscus, current injury, right knee, initial encounter: Secondary | ICD-10-CM | POA: Insufficient documentation

## 2020-12-09 DIAGNOSIS — X58XXXA Exposure to other specified factors, initial encounter: Secondary | ICD-10-CM | POA: Diagnosis not present

## 2020-12-09 DIAGNOSIS — Y939 Activity, unspecified: Secondary | ICD-10-CM | POA: Insufficient documentation

## 2020-12-09 DIAGNOSIS — Z8249 Family history of ischemic heart disease and other diseases of the circulatory system: Secondary | ICD-10-CM | POA: Insufficient documentation

## 2020-12-09 DIAGNOSIS — Z91013 Allergy to seafood: Secondary | ICD-10-CM | POA: Insufficient documentation

## 2020-12-09 HISTORY — PX: KNEE ARTHROSCOPY WITH MEDIAL MENISECTOMY: SHX5651

## 2020-12-09 SURGERY — ARTHROSCOPY, KNEE, WITH MEDIAL MENISCECTOMY
Anesthesia: General | Site: Knee | Laterality: Right

## 2020-12-09 MED ORDER — METHYLPREDNISOLONE ACETATE 40 MG/ML IJ SUSP
INTRAMUSCULAR | Status: AC
  Filled 2020-12-09: qty 1

## 2020-12-09 MED ORDER — CEFAZOLIN SODIUM-DEXTROSE 2-4 GM/100ML-% IV SOLN
2.0000 g | INTRAVENOUS | Status: AC
  Administered 2020-12-09: 2 g via INTRAVENOUS

## 2020-12-09 MED ORDER — PROPOFOL 500 MG/50ML IV EMUL
INTRAVENOUS | Status: AC
  Filled 2020-12-09: qty 100

## 2020-12-09 MED ORDER — LACTATED RINGERS IV SOLN: INTRAVENOUS | Status: DC

## 2020-12-09 MED ORDER — CELECOXIB 200 MG PO CAPS: 200.0000 mg | ORAL_CAPSULE | Freq: Two times a day (BID) | ORAL | 0 refills | Status: AC | PRN

## 2020-12-09 MED ORDER — BUPIVACAINE-EPINEPHRINE (PF) 0.5% -1:200000 IJ SOLN
INTRAMUSCULAR | Status: AC
  Filled 2020-12-09: qty 30

## 2020-12-09 MED ORDER — FENTANYL CITRATE (PF) 100 MCG/2ML IJ SOLN
INTRAMUSCULAR | Status: DC | PRN
  Administered 2020-12-09 (×2): 50 ug via INTRAVENOUS

## 2020-12-09 MED ORDER — MIDAZOLAM HCL 5 MG/5ML IJ SOLN
INTRAMUSCULAR | Status: DC | PRN
  Administered 2020-12-09: 2 mg via INTRAVENOUS

## 2020-12-09 MED ORDER — ASPIRIN EC 81 MG PO TBEC: 81.0000 mg | DELAYED_RELEASE_TABLET | Freq: Two times a day (BID) | ORAL | 0 refills | Status: AC

## 2020-12-09 MED ORDER — ONDANSETRON HCL 4 MG/2ML IJ SOLN: 4.0000 mg | Freq: Once | INTRAMUSCULAR | Status: DC | PRN

## 2020-12-09 MED ORDER — DEXMEDETOMIDINE (PRECEDEX) IN NS 20 MCG/5ML (4 MCG/ML) IV SYRINGE: PREFILLED_SYRINGE | INTRAVENOUS | Status: DC | PRN

## 2020-12-09 MED ORDER — LIDOCAINE HCL (CARDIAC) PF 100 MG/5ML IV SOSY
PREFILLED_SYRINGE | INTRAVENOUS | Status: DC | PRN
  Administered 2020-12-09: 60 mg via INTRAVENOUS

## 2020-12-09 MED ORDER — FENTANYL CITRATE (PF) 100 MCG/2ML IJ SOLN
INTRAMUSCULAR | Status: AC
  Filled 2020-12-09: qty 2

## 2020-12-09 MED ORDER — KETOROLAC TROMETHAMINE 30 MG/ML IJ SOLN
INTRAMUSCULAR | Status: DC | PRN
  Administered 2020-12-09: 30 mg via INTRAVENOUS

## 2020-12-09 MED ORDER — CEFAZOLIN SODIUM-DEXTROSE 1-4 GM/50ML-% IV SOLN
INTRAVENOUS | Status: AC
  Filled 2020-12-09: qty 100

## 2020-12-09 MED ORDER — HYDROCODONE-ACETAMINOPHEN 7.5-325 MG PO TABS: 1.0000 | ORAL_TABLET | Freq: Once | ORAL | Status: DC | PRN

## 2020-12-09 MED ORDER — PROPOFOL 10 MG/ML IV BOLUS
INTRAVENOUS | Status: DC | PRN
  Administered 2020-12-09: 200 mg via INTRAVENOUS

## 2020-12-09 MED ORDER — EPINEPHRINE PF 1 MG/ML IJ SOLN
INTRAMUSCULAR | Status: AC
  Filled 2020-12-09: qty 3

## 2020-12-09 MED ORDER — DEXMEDETOMIDINE (PRECEDEX) IN NS 20 MCG/5ML (4 MCG/ML) IV SYRINGE
PREFILLED_SYRINGE | INTRAVENOUS | Status: DC | PRN
  Administered 2020-12-09: 8 ug via INTRAVENOUS

## 2020-12-09 MED ORDER — PROPOFOL 500 MG/50ML IV EMUL
INTRAVENOUS | Status: DC | PRN
  Administered 2020-12-09: 25 ug/kg/min via INTRAVENOUS

## 2020-12-09 MED ORDER — FENTANYL CITRATE (PF) 100 MCG/2ML IJ SOLN: 25.0000 ug | INTRAMUSCULAR | Status: DC | PRN

## 2020-12-09 MED ORDER — BUPIVACAINE-EPINEPHRINE 0.5% -1:200000 IJ SOLN
INTRAMUSCULAR | Status: DC | PRN
  Administered 2020-12-09: 30 mL

## 2020-12-09 MED ORDER — ONDANSETRON HCL 4 MG/2ML IJ SOLN
INTRAMUSCULAR | Status: DC | PRN
  Administered 2020-12-09: 4 mg via INTRAVENOUS

## 2020-12-09 MED ORDER — MIDAZOLAM HCL 2 MG/2ML IJ SOLN
INTRAMUSCULAR | Status: AC
  Filled 2020-12-09: qty 2

## 2020-12-09 MED ORDER — METHYLPREDNISOLONE ACETATE 80 MG/ML IJ SUSP
INTRAMUSCULAR | Status: AC
  Filled 2020-12-09: qty 1

## 2020-12-09 MED ORDER — DEXAMETHASONE SODIUM PHOSPHATE 10 MG/ML IJ SOLN
INTRAMUSCULAR | Status: DC | PRN
  Administered 2020-12-09: 4 mg via INTRAVENOUS

## 2020-12-09 MED ORDER — SODIUM CHLORIDE 0.9 % IV SOLN: INTRAVENOUS | Status: DC

## 2020-12-09 MED ORDER — SODIUM CHLORIDE 0.9 % IR SOLN
Status: DC | PRN
  Administered 2020-12-09: 3000 mL

## 2020-12-09 MED ORDER — DEXMEDETOMIDINE (PRECEDEX) IN NS 20 MCG/5ML (4 MCG/ML) IV SYRINGE
PREFILLED_SYRINGE | INTRAVENOUS | Status: AC
  Filled 2020-12-09: qty 10

## 2020-12-09 SURGICAL SUPPLY — 39 items
BANDAGE ESMARK 6X9 LF (GAUZE/BANDAGES/DRESSINGS) IMPLANT
BNDG CMPR 9X6 STRL LF SNTH (GAUZE/BANDAGES/DRESSINGS)
BNDG ELASTIC 6X5.8 VLCR STR LF (GAUZE/BANDAGES/DRESSINGS) IMPLANT
BNDG ESMARK 6X9 LF (GAUZE/BANDAGES/DRESSINGS)
BNDG GAUZE ELAST 4 BULKY (GAUZE/BANDAGES/DRESSINGS) ×2 IMPLANT
COVER WAND RF STERILE (DRAPES) IMPLANT
CUFF TOURN SGL QUICK 34 (TOURNIQUET CUFF) ×2
CUFF TRNQT CYL 34X4.125X (TOURNIQUET CUFF) ×1 IMPLANT
DISSECTOR 3.5MM X 13CM CVD (MISCELLANEOUS) IMPLANT
DISSECTOR 4.0MM X 13CM (MISCELLANEOUS) IMPLANT
DRAPE ARTHROSCOPY W/POUCH 90 (DRAPES) ×2 IMPLANT
DRSG EMULSION OIL 3X3 NADH (GAUZE/BANDAGES/DRESSINGS) ×2 IMPLANT
DURAPREP 26ML APPLICATOR (WOUND CARE) ×2 IMPLANT
EXCALIBUR 3.8MM X 13CM (MISCELLANEOUS) ×2 IMPLANT
GAUZE SPONGE 4X4 12PLY STRL (GAUZE/BANDAGES/DRESSINGS) ×2 IMPLANT
GLOVE SRG 8 PF TXTR STRL LF DI (GLOVE) ×2 IMPLANT
GLOVE SURG ENC MOIS LTX SZ6.5 (GLOVE) ×2 IMPLANT
GLOVE SURG ENC MOIS LTX SZ7.5 (GLOVE) ×2 IMPLANT
GLOVE SURG ORTHO LTX SZ8 (GLOVE) ×2 IMPLANT
GLOVE SURG UNDER POLY LF SZ7 (GLOVE) ×2 IMPLANT
GLOVE SURG UNDER POLY LF SZ8 (GLOVE) ×4
GOWN STRL REUS W/ TWL LRG LVL3 (GOWN DISPOSABLE) ×1 IMPLANT
GOWN STRL REUS W/ TWL XL LVL3 (GOWN DISPOSABLE) ×1 IMPLANT
GOWN STRL REUS W/TWL LRG LVL3 (GOWN DISPOSABLE) ×2
GOWN STRL REUS W/TWL XL LVL3 (GOWN DISPOSABLE) ×4 IMPLANT
HOLDER KNEE FOAM BLUE (MISCELLANEOUS) ×2 IMPLANT
MANIFOLD NEPTUNE II (INSTRUMENTS) ×2 IMPLANT
NDL SAFETY ECLIPSE 18X1.5 (NEEDLE) ×1 IMPLANT
NEEDLE HYPO 18GX1.5 SHARP (NEEDLE) ×2
PACK ARTHROSCOPY DSU (CUSTOM PROCEDURE TRAY) ×2 IMPLANT
PACK BASIN DAY SURGERY FS (CUSTOM PROCEDURE TRAY) ×2 IMPLANT
PORT APPOLLO RF 90DEGREE MULTI (SURGICAL WAND) IMPLANT
SUT ETHILON 4 0 PS 2 18 (SUTURE) ×2 IMPLANT
SYR 3ML 23GX1 SAFETY (SYRINGE) ×2 IMPLANT
SYR 5ML LL (SYRINGE) ×2 IMPLANT
TOWEL GREEN STERILE FF (TOWEL DISPOSABLE) ×2 IMPLANT
TUBING ARTHROSCOPY IRRIG 16FT (MISCELLANEOUS) ×2 IMPLANT
WATER STERILE IRR 1000ML POUR (IV SOLUTION) ×2 IMPLANT
WRAP KNEE MAXI GEL POST OP (GAUZE/BANDAGES/DRESSINGS) ×2 IMPLANT

## 2020-12-09 NOTE — Brief Op Note (Signed)
12/09/2020  9:29 AM  PATIENT:  Nathaniel Barr  40 y.o. male  PRE-OPERATIVE DIAGNOSIS:  RIGHT KNEE MENISCUS TEAR  POST-OPERATIVE DIAGNOSIS:  RIGHT KNEE MENISCUS TEAR  PROCEDURE:  Procedure(s): KNEE ARTHROSCOPY WITH PARTIAL MEDIAL MENISECTOMY (Right)  SURGEON:  Surgeon(s) and Role:    Frederico Hamman, MD - Primary  PHYSICIAN ASSISTANT:   ASSISTANTS: none   ANESTHESIA:   general  EBL:  minimal   BLOOD ADMINISTERED:none  DRAINS: none   LOCAL MEDICATIONS USED:  NONE  SPECIMEN:  No Specimen  DISPOSITION OF SPECIMEN:  N/A  COUNTS:  YES  TOURNIQUET:  * Missing tourniquet times found for documented tourniquets in log: [id number] *  DICTATION: .Other Dictation: Dictation Number unknown  PLAN OF CARE: Discharge to home after PACU  PATIENT DISPOSITION:  PACU - hemodynamically stable.   Delay start of Pharmacological VTE agent (>24hrs) due to surgical blood loss or risk of bleeding: yes

## 2020-12-09 NOTE — Transfer of Care (Signed)
Immediate Anesthesia Transfer of Care Note  Patient: Nathaniel Barr  Procedure(s) Performed: KNEE ARTHROSCOPY WITH PARTIAL MEDIAL MENISECTOMY (Right Knee)  Patient Location: PACU  Anesthesia Type:General  Level of Consciousness: drowsy and patient cooperative  Airway & Oxygen Therapy: Patient Spontanous Breathing and Patient connected to face mask oxygen  Post-op Assessment: Report given to RN and Post -op Vital signs reviewed and stable  Post vital signs: Reviewed and stable  Last Vitals:  Vitals Value Taken Time  BP    Temp    Pulse 62 12/09/20 0922  Resp 20 12/09/20 0922  SpO2 98 % 12/09/20 0922  Vitals shown include unvalidated device data.  Last Pain:  Vitals:   12/09/20 0701  TempSrc: Oral  PainSc: 4       Patients Stated Pain Goal: 3 (12/09/20 0701)  Complications: No complications documented.

## 2020-12-09 NOTE — Interval H&P Note (Signed)
History and Physical Interval Note:  12/09/2020 8:32 AM  Nathaniel Barr  has presented today for surgery, with the diagnosis of RIGHT KNEE MENISCUS TEAR.  The various methods of treatment have been discussed with the patient and family. After consideration of risks, benefits and other options for treatment, the patient has consented to  Procedure(s): KNEE ARTHROSCOPY WITH MEDIAL MENISECTOMY (Right) as a surgical intervention.  The patient's history has been reviewed, patient examined, no change in status, stable for surgery.  I have reviewed the patient's chart and labs.  Questions were answered to the patient's satisfaction.     Thera Flake

## 2020-12-09 NOTE — Anesthesia Procedure Notes (Signed)
Procedure Name: LMA Insertion Date/Time: 12/09/2020 8:46 AM Performed by: Sheryn Bison, CRNA Pre-anesthesia Checklist: Patient identified, Emergency Drugs available, Suction available and Patient being monitored Patient Re-evaluated:Patient Re-evaluated prior to induction Oxygen Delivery Method: Circle System Utilized Preoxygenation: Pre-oxygenation with 100% oxygen Induction Type: IV induction Ventilation: Mask ventilation without difficulty LMA: LMA inserted LMA Size: 4.0 Number of attempts: 1 Airway Equipment and Method: bite block Placement Confirmation: positive ETCO2 Tube secured with: Tape Dental Injury: Teeth and Oropharynx as per pre-operative assessment

## 2020-12-09 NOTE — Discharge Instructions (Signed)
Diet: As you were doing prior to hospitalization   Activity: Increase activity slowly as tolerated  No lifting or driving for 48 hrs  Shower: May shower without a dressing on post op day #3, NO SOAKING in tub   Dressing: You may change your dressing on post op day #3.  Then change the dressing daily with sterile 4"x4"s gauze dressing or band aids  Weight Bearing: weight bearing as tolerated.  To prevent constipation: you may use a stool softener such as -  Colace ( over the counter) 100 mg by mouth twice a day  Drink plenty of fluids ( prune juice may be helpful) and high fiber foods  Miralax ( over the counter) for constipation as needed.   Precautions: If you experience chest pain or shortness of breath - call 911 immediately For transfer to the hospital emergency department!!  If you develop a fever greater that 101 F, purulent drainage from wound, increased redness or drainage from wound, or calf pain -- Call the office   Follow- Up Appointment: Please call for an appointment to be seen in 1 week [facility] - [phone]  No ibuprofen/motrin until after 5:15pm today.  Post Anesthesia Home Care Instructions  Activity: Get plenty of rest for the remainder of the day. A responsible individual must stay with you for 24 hours following the procedure.  For the next 24 hours, DO NOT: -Drive a car -Advertising copywriter -Drink alcoholic beverages -Take any medication unless instructed by your physician -Make any legal decisions or sign important papers.  Meals: Start with liquid foods such as gelatin or soup. Progress to regular foods as tolerated. Avoid greasy, spicy, heavy foods. If nausea and/or vomiting occur, drink only clear liquids until the nausea and/or vomiting subsides. Call your physician if vomiting continues.  Special Instructions/Symptoms: Your throat may feel dry or sore from the anesthesia or the breathing tube placed in your throat during surgery. If this causes  discomfort, gargle with warm salt water. The discomfort should disappear within 24 hours.  If you had a scopolamine patch placed behind your ear for the management of post- operative nausea and/or vomiting:  1. The medication in the patch is effective for 72 hours, after which it should be removed.  Wrap patch in a tissue and discard in the trash. Wash hands thoroughly with soap and water. 2. You may remove the patch earlier than 72 hours if you experience unpleasant side effects which may include dry mouth, dizziness or visual disturbances. 3. Avoid touching the patch. Wash your hands with soap and water after contact with the patch.

## 2020-12-09 NOTE — Anesthesia Postprocedure Evaluation (Signed)
Anesthesia Post Note  Patient: Nathaniel Barr  Procedure(s) Performed: KNEE ARTHROSCOPY WITH PARTIAL MEDIAL MENISECTOMY (Right Knee)     Patient location during evaluation: PACU Anesthesia Type: General Level of consciousness: awake and alert and oriented Pain management: pain level controlled Vital Signs Assessment: post-procedure vital signs reviewed and stable Respiratory status: spontaneous breathing, nonlabored ventilation and respiratory function stable Cardiovascular status: blood pressure returned to baseline and stable Postop Assessment: no apparent nausea or vomiting Anesthetic complications: no   No complications documented.  Last Vitals:  Vitals:   12/09/20 0945 12/09/20 1000  BP: 123/82 123/80  Pulse: 72 74  Resp: 13 12  Temp:    SpO2: 99% 99%    Last Pain:  Vitals:   12/09/20 0945  TempSrc:   PainSc: 0-No pain                 Genita Nilsson A.

## 2020-12-10 ENCOUNTER — Encounter (HOSPITAL_BASED_OUTPATIENT_CLINIC_OR_DEPARTMENT_OTHER): Payer: Self-pay | Admitting: Orthopedic Surgery

## 2020-12-10 NOTE — Op Note (Signed)
NAME: JARIEL, DROST MEDICAL RECORD NO: [id number] ACCOUNT NO: [id number] DATE OF BIRTH: 08-Jul-1980 FACILITY: [facility] LOCATION: [facility] PHYSICIAN: W D. Carloyn Manner., MD  Operative Report   DATE OF PROCEDURE: 12/09/2020  PREOPERATIVE DIAGNOSIS:  Bucket handle tear, right medial meniscus.  POSTOPERATIVE DIAGNOSIS:  Bucket handle tear, right medial meniscus.  PROCEDURE:  Excision bucket handle tear, right medial meniscus.  SURGEON: Thera Flake., MD  ANESTHESIA:  General with local supplementation.  DESCRIPTION OF PROCEDURE:  Leg holder with no tourniquet.  Inferior medial and inferior lateral portal was created.  Patellofemoral area and lateral compartment, lateral meniscus and ACL were normal.  There was a complex bucket handle tear of the medial  meniscus.  It was already truncated in its mid portion.  I resected the posterior remnant and the anterior remnant, estimated about a 70% meniscectomy with a good rim posteriorly and also anteriorly in the mid portion.  There were no articular cartilage  abnormalities.  Again, partial medial meniscectomy was carried out.  The knee drained clear fluid.  Portals closed with nylon.  The joint and the subcutaneous tissues were infiltrated with 30 mL 0.5% Marcaine.   SHW D: 12/09/2020 9:21:14 am T: 12/09/2020 9:38:00 am  JOB: [id number]/ [id number]

## 2021-09-22 DIAGNOSIS — M1A09X0 Idiopathic chronic gout, multiple sites, without tophus (tophi): Secondary | ICD-10-CM | POA: Diagnosis not present

## 2021-09-22 DIAGNOSIS — M25572 Pain in left ankle and joints of left foot: Secondary | ICD-10-CM | POA: Diagnosis not present

## 2022-02-20 IMAGING — DX DG KNEE COMPLETE 4+V*R*
1 series · 4 of 4 positions shown · non-contrast
Comparison: None.

CLINICAL DATA: Twisting injury earlier today with a popping noise.
Now with right knee pain and inability to bear weight. Can not
straighten knee.

EXAM:
RIGHT KNEE - COMPLETE 4+ VIEW

[Series 1: knee · 0.14mm/px · 4 of 4 slices shown]
[im 1/4]
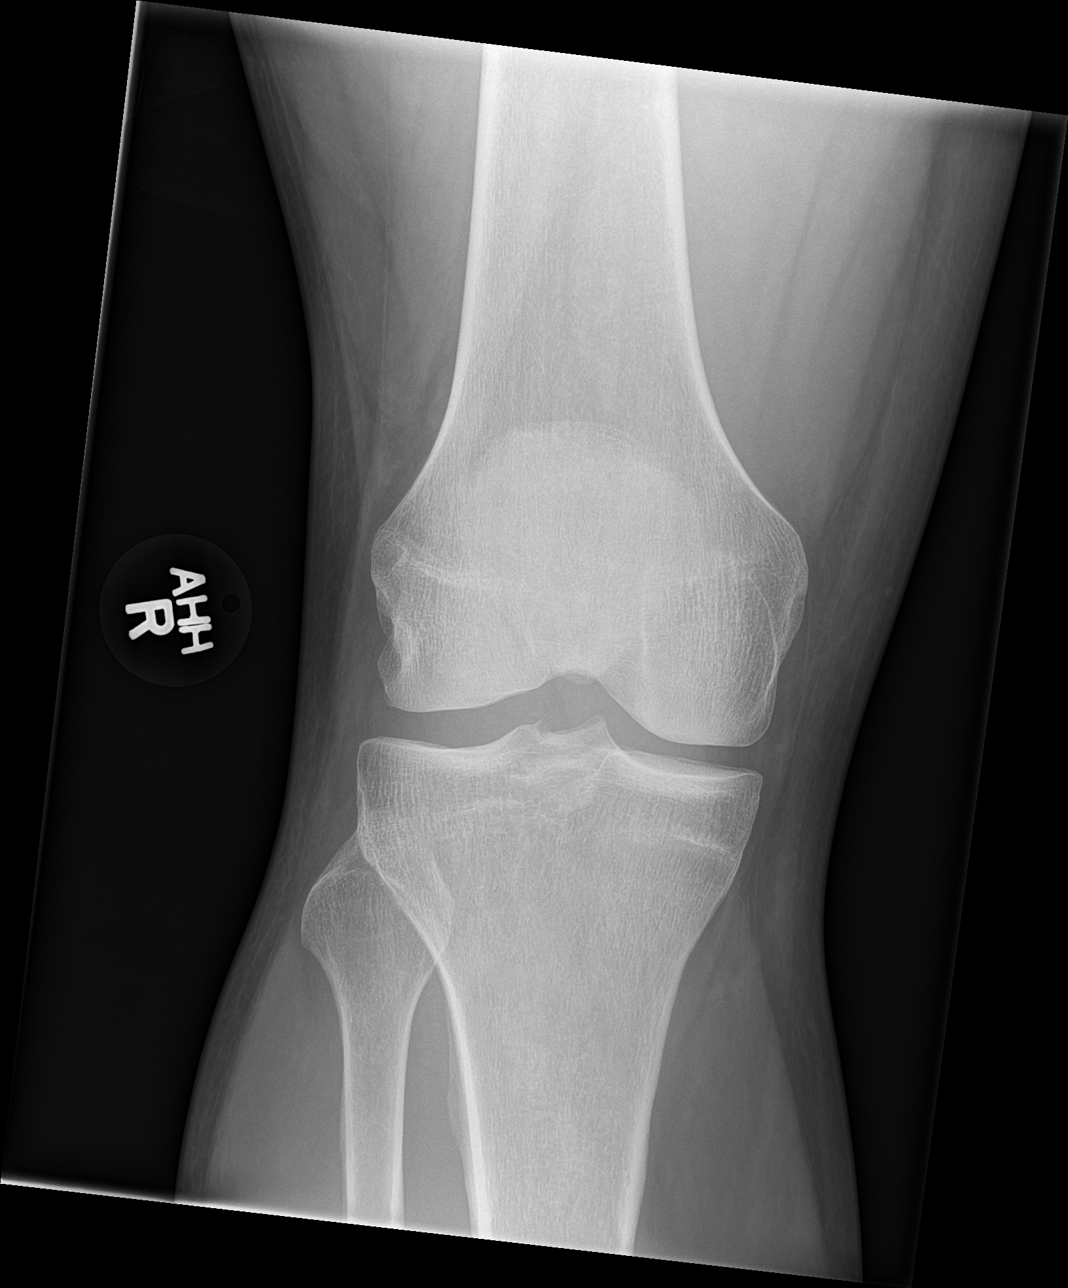
[im 2/4]
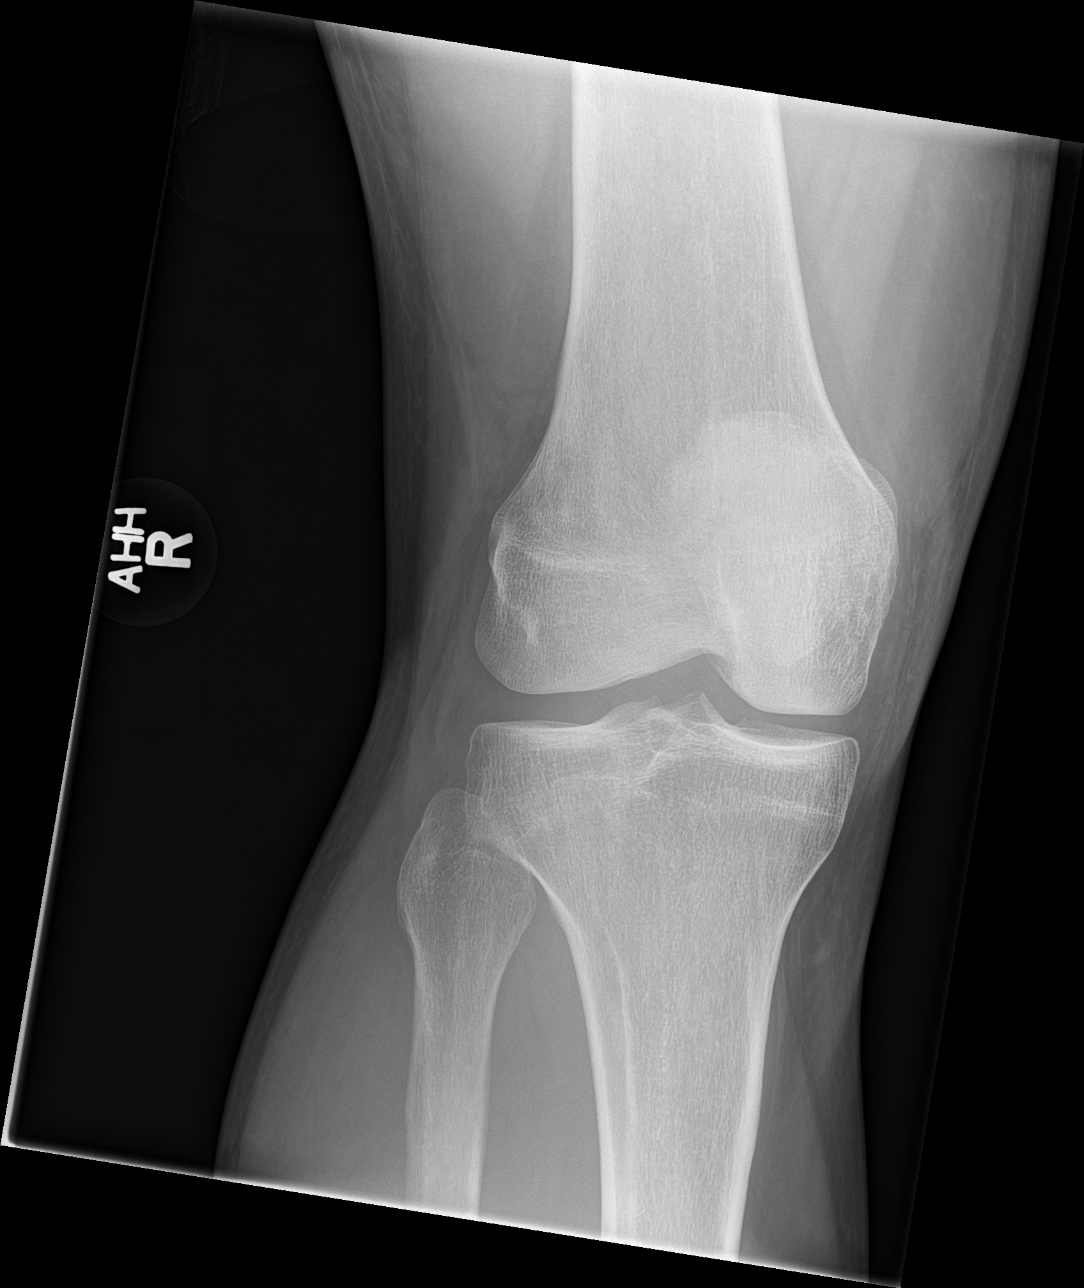
[im 3/4]
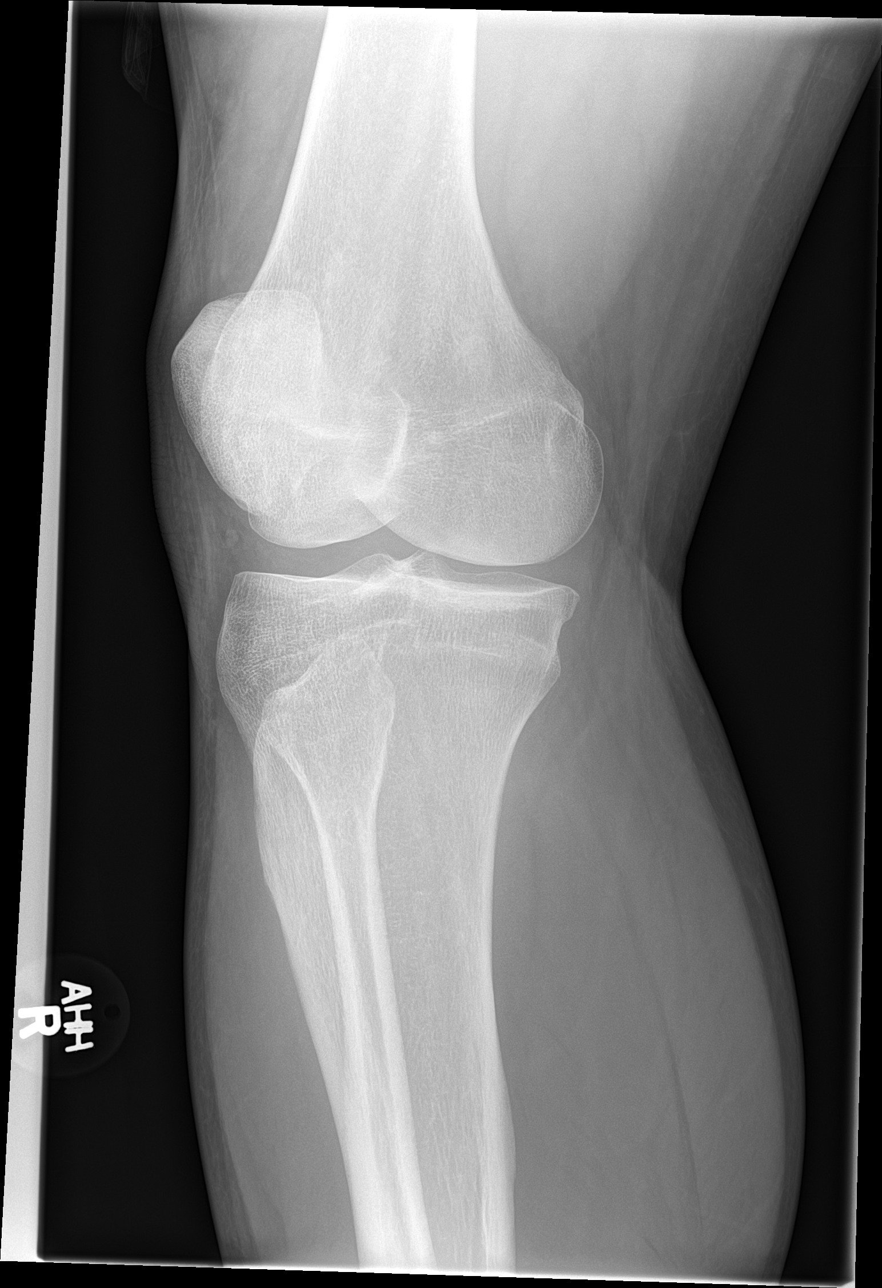
[im 4/4]
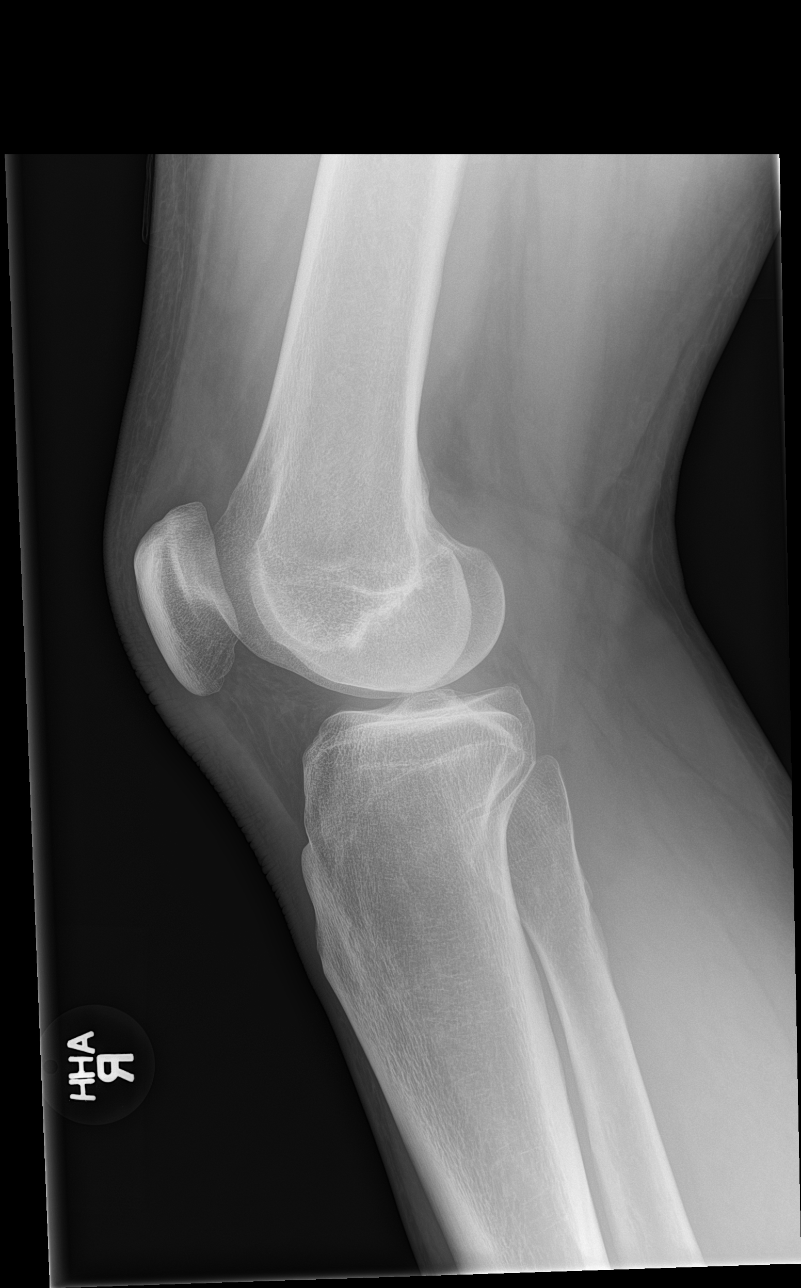

[4 of 4 positions shown; findings below may reference images not displayed]

FINDINGS: No evidence of fracture, dislocation, or joint effusion. No evidence
of arthropathy or other focal bone abnormality. Soft tissues are
unremarkable.
IMPRESSION: Negative.

## 2022-03-28 DIAGNOSIS — M25572 Pain in left ankle and joints of left foot: Secondary | ICD-10-CM | POA: Diagnosis not present

## 2022-03-28 DIAGNOSIS — M1A09X0 Idiopathic chronic gout, multiple sites, without tophus (tophi): Secondary | ICD-10-CM | POA: Diagnosis not present

## 2022-09-26 DIAGNOSIS — R252 Cramp and spasm: Secondary | ICD-10-CM | POA: Diagnosis not present

## 2022-09-26 DIAGNOSIS — M1A09X0 Idiopathic chronic gout, multiple sites, without tophus (tophi): Secondary | ICD-10-CM | POA: Diagnosis not present

## 2022-09-26 DIAGNOSIS — M25572 Pain in left ankle and joints of left foot: Secondary | ICD-10-CM | POA: Diagnosis not present

## 2023-02-06 ENCOUNTER — Emergency Department (HOSPITAL_BASED_OUTPATIENT_CLINIC_OR_DEPARTMENT_OTHER): Payer: BC Managed Care – PPO

## 2023-02-06 ENCOUNTER — Other Ambulatory Visit: Payer: Self-pay

## 2023-02-06 ENCOUNTER — Emergency Department (HOSPITAL_BASED_OUTPATIENT_CLINIC_OR_DEPARTMENT_OTHER)
Admission: EM | Admit: 2023-02-06 | Discharge: 2023-02-06 | Disposition: A | Discharge status: Other Acute Inpatient Hospital | Payer: BC Managed Care – PPO | Attending: Emergency Medicine | Admitting: Emergency Medicine

## 2023-02-06 DIAGNOSIS — T1582XA Foreign body in other and multiple parts of external eye, left eye, initial encounter: Secondary | ICD-10-CM | POA: Insufficient documentation

## 2023-02-06 DIAGNOSIS — S0590XA Unspecified injury of unspecified eye and orbit, initial encounter: Secondary | ICD-10-CM | POA: Diagnosis not present

## 2023-02-06 DIAGNOSIS — S0592XA Unspecified injury of left eye and orbit, initial encounter: Secondary | ICD-10-CM | POA: Diagnosis not present

## 2023-02-06 DIAGNOSIS — S0532XA Ocular laceration without prolapse or loss of intraocular tissue, left eye, initial encounter: Secondary | ICD-10-CM | POA: Diagnosis not present

## 2023-02-06 DIAGNOSIS — W228XXA Striking against or struck by other objects, initial encounter: Secondary | ICD-10-CM | POA: Diagnosis not present

## 2023-02-06 LAB — CBC WITH DIFFERENTIAL/PLATELET
Abs Immature Granulocytes: 0.03 10*3/uL (ref 0.00–0.07)
Basophils Absolute: 0.1 10*3/uL (ref 0.0–0.1)
Basophils Relative: 1 %
Eosinophils Absolute: 0.1 10*3/uL (ref 0.0–0.5)
Eosinophils Relative: 1 %
HCT: 44.4 % (ref 39.0–52.0)
Hemoglobin: 15.5 g/dL (ref 13.0–17.0)
Immature Granulocytes: 0 %
Lymphocytes Relative: 24 %
Lymphs Abs: 2.5 10*3/uL (ref 0.7–4.0)
MCH: 29.5 pg (ref 26.0–34.0)
MCHC: 34.9 g/dL (ref 30.0–36.0)
MCV: 84.6 fL (ref 80.0–100.0)
Monocytes Absolute: 0.8 10*3/uL (ref 0.1–1.0)
Monocytes Relative: 7 %
Neutro Abs: 7 10*3/uL (ref 1.7–7.7)
Neutrophils Relative %: 67 %
Platelets: 274 10*3/uL (ref 150–400)
RBC: 5.25 MIL/uL (ref 4.22–5.81)
RDW: 12.3 % (ref 11.5–15.5)
WBC: 10.5 10*3/uL (ref 4.0–10.5)
nRBC: 0 % (ref 0.0–0.2)

## 2023-02-06 LAB — BASIC METABOLIC PANEL
Anion gap: 12 (ref 5–15)
BUN: 17 mg/dL (ref 6–20)
CO2: 27 mmol/L (ref 22–32)
Calcium: 9.7 mg/dL (ref 8.9–10.3)
Chloride: 102 mmol/L (ref 98–111)
Creatinine, Ser: 0.91 mg/dL (ref 0.61–1.24)
GFR, Estimated: >60 mL/min (ref >60)
Glucose, Bld: 106 mg/dL — ABNORMAL HIGH (ref 70–99)
Potassium: 3.5 mmol/L (ref 3.5–5.1)
Sodium: 141 mmol/L (ref 135–145)

## 2023-02-06 MED ORDER — MORPHINE SULFATE (PF) 4 MG/ML IV SOLN
4.0000 mg | INTRAVENOUS | Status: DC | PRN
  Administered 2023-02-06: 4 mg via INTRAVENOUS
  Filled 2023-02-06: qty 1

## 2023-02-06 MED ORDER — CEFAZOLIN SODIUM-DEXTROSE 1-4 GM/50ML-% IV SOLN
1.0000 g | Freq: Once | INTRAVENOUS | Status: AC
  Administered 2023-02-06: 1 g via INTRAVENOUS
  Filled 2023-02-06: qty 50

## 2023-02-06 MED ORDER — DIPHENHYDRAMINE HCL 50 MG/ML IJ SOLN
50.0000 mg | Freq: Once | INTRAMUSCULAR | Status: AC
  Administered 2023-02-06: 50 mg via INTRAVENOUS
  Filled 2023-02-06: qty 1

## 2023-02-06 MED ORDER — MORPHINE SULFATE (PF) 4 MG/ML IV SOLN: 4.0000 mg | Freq: Once | INTRAVENOUS | Status: DC

## 2023-02-06 MED ORDER — FENTANYL CITRATE PF 50 MCG/ML IJ SOSY
50.0000 ug | PREFILLED_SYRINGE | Freq: Once | INTRAMUSCULAR | Status: AC
  Administered 2023-02-06: 50 ug via INTRAVENOUS
  Filled 2023-02-06: qty 1

## 2023-02-06 MED ORDER — PROCHLORPERAZINE EDISYLATE 10 MG/2ML IJ SOLN
10.0000 mg | Freq: Once | INTRAMUSCULAR | Status: AC
  Administered 2023-02-06: 10 mg via INTRAVENOUS
  Filled 2023-02-06: qty 2

## 2023-02-06 MED ORDER — ONDANSETRON HCL 4 MG/2ML IJ SOLN
4.0000 mg | Freq: Once | INTRAMUSCULAR | Status: AC
  Administered 2023-02-06: 4 mg via INTRAVENOUS
  Filled 2023-02-06: qty 2

## 2023-02-06 NOTE — ED Triage Notes (Signed)
Patient presents to ED via POV from work. Reports he was weed eating (steel) when a piece of concrete "kicked back" and hit him in the left eye. Reports he cannot see out of eye. Concern for ruptured globe.

## 2023-02-06 NOTE — ED Notes (Signed)
Face sheet faxed to [facility]

## 2023-02-06 NOTE — Discharge Instructions (Addendum)
Go straight to [facility] emergency department do not eat or drink or stop anywhere

## 2023-02-06 NOTE — ED Notes (Signed)
Report called to [facility] ED. Report given to Tressa Busman, RN.

## 2023-02-06 NOTE — ED Notes (Signed)
Dressing placed on L eye by Doran Durand, MD. IV secured. Pt wife instructed to drive straight to [facility]. Pt and family members verbalize understanding.

## 2023-02-06 NOTE — ED Provider Notes (Signed)
Patient received in transfer from [facility] for concern for globe rupture.  Sounds like he is doing yard work earlier today when a piece of rock or metal flew up into his left eye.  Immediately lost vision to his eye.  Provider spoke with ophthalmology who recommends transfer consult upon reassessment Physical Exam  BP 135/82 (BP Location: Left Arm)   Pulse 64   Temp 97.6 F (36.4 C)   Resp 20   Ht 6' (1.829 m)   Wt 97.5 kg   SpO2 100%   BMI 29.16 kg/m   Physical Exam Vitals and nursing note reviewed.  Constitutional:      General: He is not in acute distress.    Appearance: He is well-developed. He is not ill-appearing, toxic-appearing or diaphoretic.  HENT:     Head: Atraumatic.  Eyes:     Pupils: Pupils are equal, round, and reactive to light.     Comments: Rigid eye shield to left eye, slight oozing of blood  Cardiovascular:     Rate and Rhythm: Normal rate and regular rhythm.  Pulmonary:     Effort: Pulmonary effort is normal. No respiratory distress.  Abdominal:     General: There is no distension.     Palpations: Abdomen is soft.  Musculoskeletal:        General: Normal range of motion.     Cervical back: Normal range of motion and neck supple.  Skin:    General: Skin is warm and dry.  Neurological:     General: No focal deficit present.     Mental Status: He is alert and oriented to person, place, and time.     Procedures  .Critical Care  Performed by: Linwood Dibbles, PA-C Authorized by: Linwood Dibbles, PA-C   Critical care provider statement:    Critical care time (minutes):  35   Critical care was necessary to treat or prevent imminent or life-threatening deterioration of the following conditions:  Trauma   Critical care was time spent personally by me on the following activities:  Development of treatment plan with patient or surrogate, discussions with consultants, evaluation of patient's response to treatment, examination of patient,  ordering and review of laboratory studies, ordering and review of radiographic studies, ordering and performing treatments and interventions, pulse oximetry, re-evaluation of patient's condition and review of old charts  Labs Reviewed  BASIC METABOLIC PANEL - Abnormal; Notable for the following components:      Result Value   Glucose, Bld 106 (*)    All other components within normal limits  CBC WITH DIFFERENTIAL/PLATELET   CT Orbits Wo Contrast  Result Date: 02/06/2023 CLINICAL DATA:  eval for traumatic iritis EXAM: CT ORBITS WITHOUT CONTRAST TECHNIQUE: Multidetector CT imaging of the orbits was performed using the standard protocol without intravenous contrast. Multiplanar CT image reconstructions were also generated. RADIATION DOSE REDUCTION: This exam was performed according to the departmental dose-optimization program which includes automated exposure control, adjustment of the mA and/or kV according to patient size and/or use of iterative reconstruction technique. COMPARISON:  None Available. FINDINGS: Orbits: The left globe is asymmetrically small in size with a small amount of intravitreal blood products (series 8,image 54)and a defect along the upper outer quadrant of the globe (series 8, image 56). There is no evidence of metallic foreign body. Visible paranasal sinuses: Polypoid mucosal thickening in the right maxillary sinus. Soft tissues: Normal. Osseous: No fracture or aggressive lesion. Limited intracranial: No acute or significant finding.  IMPRESSION: Findings are concerning for traumatic left globe rupture. Recommend ophthalmology consultation. No evidence of metallic foreign body Findings were discussed with Dr. Doran Durand on 02/06/23 at 7:11 PM. Electronically Signed   By: Lorenza Cambridge M.D.   On: 02/06/2023 19:11    ED Course / MDM   Clinical Course as of 02/07/23 0014  Mon Feb 06, 2023  5638 Call from radiology with CT orbits results  [CC]  2141 Optho saw here, Needs tx to [facility]  for a retina specialist, Rock in eye [BH]    Clinical Course User Index [BH] Demeshia Sherburne A, PA-C [CC] Glyn Ade, MD   Patient received in transfer from [facility] for concern for globe rupture.  Sounds like he is doing yard work earlier today when a piece of rock or metal flew up into his left eye.  Immediately lost vision to his eye.  Provider spoke with ophthalmology who recommends transfer consult upon reassessment  2027: [facility], Dr. Jenene Slicker  Patient seen in the ED by Dr. Georga Hacking with ophthalmology.  Patient has foreign object most likely a rock and needs a retina specialist and transfer to higher level of care at [facility]  Discussed with Dr. Mertha Finders with [facility] ophthalmology.  Accepts patient in transfer.  Discussed transfer.  Patient and family prefer to go POV. Discussed risk vs benefit of POV  Patient transferred to [facility] for surgical consultation.    Medical Decision Making Amount and/or Complexity of Data Reviewed External Data Reviewed: labs, radiology and notes. Labs: ordered. Decision-making details documented in ED Course. Radiology: ordered and independent interpretation performed. Decision-making details documented in ED Course.  Risk OTC drugs. Prescription drug management. Parenteral controlled substances. Decision regarding hospitalization. Diagnosis or treatment significantly limited by social determinants of health. Emergency major surgery.    Globe Rupture Retained foreign object      Derral Colucci A, PA-C 02/07/23 0014    Rexford Maus, DO 02/07/23 2312

## 2023-02-06 NOTE — ED Provider Notes (Signed)
[place] EMERGENCY DEPARTMENT AT [facility] Provider Note   CSN: [id number] Arrival date & time: 02/06/23  1639     History Chief Complaint  Patient presents with   Eye Pain    HPI Nathaniel Barr is a 42 y.o. male presenting for chief complaint of eye injury.  States that he he was edging the side of a driveway when a rock metal edger in the rock was thrown up into his eye.  He thinks he close his eye and hit the outside but he immediately lost vision.  Patient's recorded medical, surgical, social, medication list and allergies were reviewed in the Snapshot window as part of the initial history.   Review of Systems   Review of Systems  Unable to perform ROS: Acuity of condition    Physical Exam Updated Vital Signs BP 135/74   Pulse (!) 55   Temp 98.3 F (36.8 C)   Resp 18   Ht 6' (1.829 m)   Wt 97.5 kg   SpO2 99%   BMI 29.16 kg/m  Physical Exam Vitals and nursing note reviewed.  Constitutional:      General: He is not in acute distress.    Appearance: He is well-developed.  HENT:     Head: Normocephalic and atraumatic.  Eyes:     Conjunctiva/sclera: Conjunctivae normal.  Cardiovascular:     Rate and Rhythm: Normal rate and regular rhythm.     Heart sounds: No murmur heard. Pulmonary:     Effort: Pulmonary effort is normal. No respiratory distress.     Breath sounds: Normal breath sounds.  Abdominal:     Palpations: Abdomen is soft.     Tenderness: There is no abdominal tenderness.  Musculoskeletal:        General: Deformity (No visual acuity out of the left eye.  Only can see bright light.  Approximately 90% hyphema appreciated.) present. No swelling.     Cervical back: Neck supple.  Skin:    General: Skin is warm and dry.     Capillary Refill: Capillary refill takes less than 2 seconds.  Neurological:     Mental Status: He is alert.  Psychiatric:        Mood and Affect: Mood normal.      ED Course/ Medical Decision Making/  A&P Clinical Course as of 02/06/23 1952  Mon Feb 06, 2023  1909 Call from radiology with CT orbits results  [CC]    Clinical Course User Index [CC] Glyn Ade, MD    Procedures .Critical Care  Performed by: Glyn Ade, MD Authorized by: Glyn Ade, MD   Critical care provider statement:    Critical care time (minutes):  30   Critical care was necessary to treat or prevent imminent or life-threatening deterioration of the following conditions:  Trauma   Critical care was time spent personally by me on the following activities:  Development of treatment plan with patient or surrogate, discussions with consultants, evaluation of patient's response to treatment, examination of patient, ordering and review of laboratory studies, ordering and review of radiographic studies, ordering and performing treatments and interventions, pulse oximetry, re-evaluation of patient's condition and review of old charts   Care discussed with: accepting provider at another facility      Medications Ordered in ED Medications  morphine (PF) 4 MG/ML injection 4 mg (4 mg Intravenous Given 02/06/23 1749)  ceFAZolin (ANCEF) IVPB 1 g/50 mL premix (has no administration in time range)  ondansetron (ZOFRAN) injection 4 mg (  4 mg Intravenous Given 02/06/23 1807)  prochlorperazine (COMPAZINE) injection 10 mg (10 mg Intravenous Given 02/06/23 1849)  diphenhydrAMINE (BENADRYL) injection 50 mg (50 mg Intravenous Given 02/06/23 1849)    Medical Decision Making:   This a patient presented with a traumatic left eye injury.  Initially thought his presentation was just hyphema given intact visual acuity to light only. However CT orbits was performed and demonstrated ruptured open globe. Consulted ophthalmology Dr.Pecern who recommended the patient be immediately transported to [facility]. Patient was having a headache so he received morphine and Compazine for nausea.  Recommended the patient get  professional transport, however family wants patient to arrive to definitive care soon as possible and they called additional family to assist in privately transporting the patient.  Given sensitivity of patient's disease to time delays, this is reasonable. From time of diagnosis ruptured globe to patient leaving the department was approximately 10 minutes. Patient did not receive antibiotics prior to transport.  They were ordered in the medical record so that they can be initiated once he is at the outside hospital.  Ophthalmology was alerted via secure chat the patient had left the stand-alone, however ophthalmology should also be alerted immediately on his arrival at the tertiary care center.   Clinical Impression:  1. Ruptured globe of left eye, initial encounter      Data Unavailable   Final Clinical Impression(s) / ED Diagnoses Final diagnoses:  Ruptured globe of left eye, initial encounter    Rx / DC Orders ED Discharge Orders     None         Glyn Ade, MD 02/06/23 [phone]

## 2023-02-06 NOTE — ED Notes (Signed)
Ophthalmologist at bedside at this time.

## 2023-02-06 NOTE — ED Notes (Signed)
ED report number for [facility] is [phone], report to be given to communications nurse.

## 2023-02-06 NOTE — ED Notes (Signed)
Alert, NAD, calm, reports "feels the same, pain severe, no change, 10/10"

## 2023-02-06 NOTE — Consult Note (Signed)
CC: Eye pain after injury to eye  HPI: Nathaniel Barr is a 42 y.o. male w/ no significant POH who presents for evaluation of open globe injury. 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 Initially thought he closed his eye in time, but was unsure. Imaging at outside urgent care showed open globe injury and was transferred for higher level of care. Pt reports pain and nausea and unable to see more than light out of eye.   ROS: Negative except as otherwise stated.  PMH: - H/o Lyme dz - Gout  PSH: Knee arthroscopy  Meds: No current facility-administered medications on file prior to encounter.   Current Outpatient Medications on File Prior to Encounter  Medication Sig Dispense Refill   allopurinol (ZYLOPRIM) 100 MG tablet Take 1 tablet (100 mg total) by mouth daily. 30 tablet 6   colchicine 0.6 MG tablet 2 tabs po at onset of gouty joint pain, then 1 tab po 2 hours later, then starting the following day take 1 tab po bid until all joint pain is gone 20 tablet 3   Multiple Vitamin (MULTIVITAMIN) tablet Take 1 tablet by mouth daily.      Past Ocular History: Non-contributory  Allergies  Allergen Reactions   Shrimp [Shellfish Allergy] Other (See Comments)    unspecified   Exam:  General: Awake, Alert, Oriented *3  Vision (near): without correction   OD: 20/20  OS: LP  Confrontational Field:   Full to count fingers, OD  Extraocular Motility: Unable to test due to pain  External: Normal Symmetry, V1-3 intact, infraorbital nerve appears intact.   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   Assessment and Plan:  Open globe injury with non-metallic IOFB OS (likely rock/concrete given history). Will need urgent surgical repair and IOFB removal. Due to inability to remove IOFB at this facility without a surgical retina specialist, will require transfer to a facility with this ability - pt has requested [facility] for this. In the meantime, continue Ancef IV and pain/nausea control. NPO for now.    Arnette Felts, MD Ophthalmology [facility] [phone]

## 2023-02-06 NOTE — ED Notes (Signed)
Patient transported to CT with this RN 

## 2023-02-07 DIAGNOSIS — Y93H2 Activity, gardening and landscaping: Secondary | ICD-10-CM | POA: Diagnosis not present

## 2023-02-07 DIAGNOSIS — Z23 Encounter for immunization: Secondary | ICD-10-CM | POA: Diagnosis not present

## 2023-02-07 DIAGNOSIS — S0552XA Penetrating wound with foreign body of left eyeball, initial encounter: Secondary | ICD-10-CM | POA: Diagnosis not present

## 2023-02-07 DIAGNOSIS — S0522XA Ocular laceration and rupture with prolapse or loss of intraocular tissue, left eye, initial encounter: Secondary | ICD-10-CM | POA: Diagnosis not present

## 2023-02-07 DIAGNOSIS — W208XXA Other cause of strike by thrown, projected or falling object, initial encounter: Secondary | ICD-10-CM | POA: Diagnosis not present

## 2023-02-07 DIAGNOSIS — S0592XA Unspecified injury of left eye and orbit, initial encounter: Secondary | ICD-10-CM | POA: Diagnosis not present

## 2023-02-07 DIAGNOSIS — S0532XA Ocular laceration without prolapse or loss of intraocular tissue, left eye, initial encounter: Secondary | ICD-10-CM | POA: Diagnosis not present

## 2023-02-10 DIAGNOSIS — S0532XA Ocular laceration without prolapse or loss of intraocular tissue, left eye, initial encounter: Secondary | ICD-10-CM | POA: Diagnosis not present

## 2023-02-23 DIAGNOSIS — S0532XA Ocular laceration without prolapse or loss of intraocular tissue, left eye, initial encounter: Secondary | ICD-10-CM | POA: Diagnosis not present

## 2023-03-20 DIAGNOSIS — H3322 Serous retinal detachment, left eye: Secondary | ICD-10-CM | POA: Diagnosis not present

## 2023-03-22 DIAGNOSIS — H3342 Traction detachment of retina, left eye: Secondary | ICD-10-CM | POA: Diagnosis not present

## 2023-03-22 DIAGNOSIS — H4312 Vitreous hemorrhage, left eye: Secondary | ICD-10-CM | POA: Diagnosis not present

## 2023-03-23 DIAGNOSIS — S0531XS Ocular laceration without prolapse or loss of intraocular tissue, right eye, sequela: Secondary | ICD-10-CM | POA: Diagnosis not present

## 2023-03-23 DIAGNOSIS — S0532XS Ocular laceration without prolapse or loss of intraocular tissue, left eye, sequela: Secondary | ICD-10-CM | POA: Diagnosis not present

## 2023-03-30 DIAGNOSIS — S0531XS Ocular laceration without prolapse or loss of intraocular tissue, right eye, sequela: Secondary | ICD-10-CM | POA: Diagnosis not present

## 2023-03-30 DIAGNOSIS — H3322 Serous retinal detachment, left eye: Secondary | ICD-10-CM | POA: Diagnosis not present

## 2023-03-30 DIAGNOSIS — S0532XS Ocular laceration without prolapse or loss of intraocular tissue, left eye, sequela: Secondary | ICD-10-CM | POA: Diagnosis not present

## 2023-04-26 DIAGNOSIS — R252 Cramp and spasm: Secondary | ICD-10-CM | POA: Diagnosis not present

## 2023-04-26 DIAGNOSIS — M1A09X0 Idiopathic chronic gout, multiple sites, without tophus (tophi): Secondary | ICD-10-CM | POA: Diagnosis not present

## 2023-04-27 DIAGNOSIS — H3322 Serous retinal detachment, left eye: Secondary | ICD-10-CM | POA: Diagnosis not present

## 2023-04-27 DIAGNOSIS — S0532XS Ocular laceration without prolapse or loss of intraocular tissue, left eye, sequela: Secondary | ICD-10-CM | POA: Diagnosis not present

## 2023-04-27 DIAGNOSIS — S0531XS Ocular laceration without prolapse or loss of intraocular tissue, right eye, sequela: Secondary | ICD-10-CM | POA: Diagnosis not present

## 2023-05-25 DIAGNOSIS — S0531XS Ocular laceration without prolapse or loss of intraocular tissue, right eye, sequela: Secondary | ICD-10-CM | POA: Diagnosis not present

## 2023-05-25 DIAGNOSIS — H3322 Serous retinal detachment, left eye: Secondary | ICD-10-CM | POA: Diagnosis not present

## 2023-05-25 DIAGNOSIS — S0532XS Ocular laceration without prolapse or loss of intraocular tissue, left eye, sequela: Secondary | ICD-10-CM | POA: Diagnosis not present

## 2023-07-25 ENCOUNTER — Ambulatory Visit: Payer: BLUE CROSS/BLUE SHIELD

## 2023-07-25 ENCOUNTER — Ambulatory Visit: Payer: BLUE CROSS/BLUE SHIELD | Admitting: Nurse Practitioner

## 2023-07-25 ENCOUNTER — Encounter: Payer: Self-pay | Admitting: Nurse Practitioner

## 2023-07-25 VITALS — BP 134/83 | HR 101 | Temp 97.2°F | Ht 72.0 in | Wt 216.0 lb

## 2023-07-25 DIAGNOSIS — J40 Bronchitis, not specified as acute or chronic: Secondary | ICD-10-CM

## 2023-07-25 DIAGNOSIS — H6501 Acute serous otitis media, right ear: Secondary | ICD-10-CM | POA: Diagnosis not present

## 2023-07-25 MED ORDER — PREDNISONE 20 MG PO TABS
40.0000 mg | ORAL_TABLET | Freq: Every day | ORAL | 0 refills | Status: AC
Start: 2023-07-25 — End: 2023-07-30

## 2023-07-25 MED ORDER — AMOXICILLIN-POT CLAVULANATE 875-125 MG PO TABS
1.0000 | ORAL_TABLET | Freq: Two times a day (BID) | ORAL | 0 refills | Status: AC
Start: 2023-07-25 — End: ?

## 2023-07-25 MED ORDER — HYDROCODONE BIT-HOMATROP MBR 5-1.5 MG/5ML PO SOLN
5.0000 mL | Freq: Four times a day (QID) | ORAL | 0 refills | Status: AC | PRN
Start: 2023-07-25 — End: ?

## 2023-07-25 NOTE — Progress Notes (Signed)
Subjective:    Patient ID: Nathaniel Barr, male    DOB: 01-27-1981, 43 y.o.   MRN: [id number]   Chief Complaint: Cough and Sinus Problem   Sinus Problem This is a new problem. The current episode started 1 to 4 weeks ago. The problem has been waxing and waning since onset. The maximum temperature recorded prior to his arrival was 100.4 - 100.9 F. The fever has been present for 3 to 4 days. His pain is at a severity of 5/10. The pain is moderate. Associated symptoms include chills, congestion, coughing, headaches and sinus pressure. Past treatments include acetaminophen. The treatment provided mild relief.    Patient Active Problem List   Diagnosis Date Noted   Gout 01/10/2018   Left foot pain 01/10/2017   Other malaise and fatigue 03/13/2013       Review of Systems  Constitutional:  Positive for chills.  HENT:  Positive for congestion and sinus pressure.   Respiratory:  Positive for cough.   Neurological:  Positive for headaches.       Objective:   Physical Exam Constitutional:      Appearance: Normal appearance.  HENT:     Right Ear: A middle ear effusion is present. Tympanic membrane is erythematous.     Left Ear: Tympanic membrane normal.     Nose: Congestion and rhinorrhea present.  Cardiovascular:     Rate and Rhythm: Normal rate and regular rhythm.     Heart sounds: Normal heart sounds.  Pulmonary:     Effort: Pulmonary effort is normal.     Breath sounds: Normal breath sounds.  Neurological:     General: No focal deficit present.     Mental Status: He is alert and oriented to person, place, and time.  Psychiatric:        Mood and Affect: Mood normal.        Behavior: Behavior normal.      BP 134/83   Pulse (!) 101   Temp (!) 97.2 F (36.2 C) (Temporal)   Ht 6' (1.829 m)   Wt 216 lb (98 kg)   SpO2 98%   BMI 29.29 kg/m       Assessment & Plan:   Lynward Bourbon in today with chief complaint of Cough and Sinus Problem   1. Non-recurrent  acute serous otitis media of right ear (Primary) - amoxicillin-clavulanate (AUGMENTIN) 875-125 MG tablet; Take 1 tablet by mouth 2 (two) times daily.  Dispense: 14 tablet; Refill: 0  2. Bronchitis 1. Take meds as prescribed 2. Use a cool mist humidifier especially during the winter months and when heat has been humid. 3. Use saline nose sprays frequently 4. Saline irrigations of the nose can be very helpful if done frequently.  * 4X daily for 1 week*  * Use of a nettie pot can be helpful with this. Follow directions with this* 5. Drink plenty of fluids 6. Keep thermostat turn down low 7.For any cough or congestion- hycodan 8. For fever or aces or pains- take tylenol or ibuprofen appropriate for age and weight.  * for fevers greater than 101 orally you may alternate ibuprofen and tylenol every  3 hours.    - amoxicillin-clavulanate (AUGMENTIN) 875-125 MG tablet; Take 1 tablet by mouth 2 (two) times daily.  Dispense: 14 tablet; Refill: 0 - HYDROcodone bit-homatropine (HYCODAN) 5-1.5 MG/5ML syrup; Take 5 mLs by mouth every 6 (six) hours as needed for cough.  Dispense: 120 mL; Refill: 0 - predniSONE (DELTASONE) 20 MG  tablet; Take 2 tablets (40 mg total) by mouth daily with breakfast for 5 days. 2 po daily for 5 days  Dispense: 10 tablet; Refill: 0    The above assessment and management plan was discussed with the patient. The patient verbalized understanding of and has agreed to the management plan. Patient is aware to call the clinic if symptoms persist or worsen. Patient is aware when to return to the clinic for a follow-up visit. Patient educated on when it is appropriate to go to the emergency department.   Mary-Margaret Daphine Deutscher, FNP

## 2023-07-25 NOTE — Patient Instructions (Signed)

## 2023-10-05 DIAGNOSIS — S0531XS Ocular laceration without prolapse or loss of intraocular tissue, right eye, sequela: Secondary | ICD-10-CM | POA: Diagnosis not present

## 2023-10-05 DIAGNOSIS — H2512 Age-related nuclear cataract, left eye: Secondary | ICD-10-CM | POA: Diagnosis not present

## 2023-10-05 DIAGNOSIS — H3322 Serous retinal detachment, left eye: Secondary | ICD-10-CM | POA: Diagnosis not present

## 2023-10-05 DIAGNOSIS — S0532XS Ocular laceration without prolapse or loss of intraocular tissue, left eye, sequela: Secondary | ICD-10-CM | POA: Diagnosis not present

## 2023-10-18 DIAGNOSIS — H30022 Focal chorioretinal inflammation of posterior pole, left eye: Secondary | ICD-10-CM | POA: Diagnosis not present

## 2023-10-18 DIAGNOSIS — Z1889 Other specified retained foreign body fragments: Secondary | ICD-10-CM | POA: Diagnosis not present

## 2023-10-18 DIAGNOSIS — H21542 Posterior synechiae (iris), left eye: Secondary | ICD-10-CM | POA: Diagnosis not present

## 2023-10-18 DIAGNOSIS — H44752 Retained (nonmagnetic) (old) foreign body in vitreous body, left eye: Secondary | ICD-10-CM | POA: Diagnosis not present

## 2023-10-18 DIAGNOSIS — H4312 Vitreous hemorrhage, left eye: Secondary | ICD-10-CM | POA: Diagnosis not present

## 2023-10-18 DIAGNOSIS — H2512 Age-related nuclear cataract, left eye: Secondary | ICD-10-CM | POA: Diagnosis not present

## 2023-10-19 DIAGNOSIS — S0531XS Ocular laceration without prolapse or loss of intraocular tissue, right eye, sequela: Secondary | ICD-10-CM | POA: Diagnosis not present

## 2023-10-19 DIAGNOSIS — S0532XS Ocular laceration without prolapse or loss of intraocular tissue, left eye, sequela: Secondary | ICD-10-CM | POA: Diagnosis not present

## 2023-10-19 DIAGNOSIS — H3322 Serous retinal detachment, left eye: Secondary | ICD-10-CM | POA: Diagnosis not present

## 2023-10-19 DIAGNOSIS — H2512 Age-related nuclear cataract, left eye: Secondary | ICD-10-CM | POA: Diagnosis not present

## 2023-10-26 DIAGNOSIS — H2512 Age-related nuclear cataract, left eye: Secondary | ICD-10-CM | POA: Diagnosis not present

## 2023-10-26 DIAGNOSIS — S0532XS Ocular laceration without prolapse or loss of intraocular tissue, left eye, sequela: Secondary | ICD-10-CM | POA: Diagnosis not present

## 2023-10-26 DIAGNOSIS — H3322 Serous retinal detachment, left eye: Secondary | ICD-10-CM | POA: Diagnosis not present

## 2023-10-26 DIAGNOSIS — S0531XS Ocular laceration without prolapse or loss of intraocular tissue, right eye, sequela: Secondary | ICD-10-CM | POA: Diagnosis not present

## 2023-12-25 DIAGNOSIS — H2512 Age-related nuclear cataract, left eye: Secondary | ICD-10-CM | POA: Diagnosis not present

## 2023-12-25 DIAGNOSIS — S0532XS Ocular laceration without prolapse or loss of intraocular tissue, left eye, sequela: Secondary | ICD-10-CM | POA: Diagnosis not present

## 2023-12-25 DIAGNOSIS — H3322 Serous retinal detachment, left eye: Secondary | ICD-10-CM | POA: Diagnosis not present

## 2023-12-25 DIAGNOSIS — S0531XS Ocular laceration without prolapse or loss of intraocular tissue, right eye, sequela: Secondary | ICD-10-CM | POA: Diagnosis not present

## 2024-04-24 DIAGNOSIS — M1A09X0 Idiopathic chronic gout, multiple sites, without tophus (tophi): Secondary | ICD-10-CM | POA: Diagnosis not present

## 2024-04-24 DIAGNOSIS — M5416 Radiculopathy, lumbar region: Secondary | ICD-10-CM | POA: Diagnosis not present

## 2024-04-29 DIAGNOSIS — S0532XS Ocular laceration without prolapse or loss of intraocular tissue, left eye, sequela: Secondary | ICD-10-CM | POA: Diagnosis not present

## 2024-04-29 DIAGNOSIS — S0531XS Ocular laceration without prolapse or loss of intraocular tissue, right eye, sequela: Secondary | ICD-10-CM | POA: Diagnosis not present

## 2024-04-29 DIAGNOSIS — H2512 Age-related nuclear cataract, left eye: Secondary | ICD-10-CM | POA: Diagnosis not present

## 2024-04-29 DIAGNOSIS — H3322 Serous retinal detachment, left eye: Secondary | ICD-10-CM | POA: Diagnosis not present
